# Patient Record
Sex: Female | Born: 1975
Health system: Southern US, Community
[De-identification: ages and names within clinical notes are randomized; demographics above are authoritative.]

## PROBLEM LIST (undated history)

## (undated) DIAGNOSIS — Z8619 Personal history of other infectious and parasitic diseases: Secondary | ICD-10-CM

## (undated) DIAGNOSIS — O24419 Gestational diabetes mellitus in pregnancy, unspecified control: Secondary | ICD-10-CM

## (undated) DIAGNOSIS — F419 Anxiety disorder, unspecified: Secondary | ICD-10-CM

## (undated) DIAGNOSIS — M4200 Juvenile osteochondrosis of spine, site unspecified: Secondary | ICD-10-CM

## (undated) DIAGNOSIS — M797 Fibromyalgia: Secondary | ICD-10-CM

## (undated) HISTORY — PX: MOUTH SURGERY: SHX715

---

## 1997-08-10 ENCOUNTER — Other Ambulatory Visit: Admission: RE | Admit: 1997-08-10 | Discharge: 1997-08-10 | Payer: Self-pay | Admitting: Obstetrics and Gynecology

## 1997-09-22 ENCOUNTER — Other Ambulatory Visit: Admission: RE | Admit: 1997-09-22 | Discharge: 1997-09-22 | Payer: Self-pay | Admitting: *Deleted

## 1998-03-09 ENCOUNTER — Emergency Department (HOSPITAL_COMMUNITY): Admission: EM | Admit: 1998-03-09 | Discharge: 1998-03-09 | Payer: Self-pay

## 1998-08-22 ENCOUNTER — Ambulatory Visit (HOSPITAL_COMMUNITY): Admission: RE | Admit: 1998-08-22 | Discharge: 1998-08-22 | Payer: Self-pay | Admitting: Family Medicine

## 1999-10-05 ENCOUNTER — Other Ambulatory Visit: Admission: RE | Admit: 1999-10-05 | Discharge: 1999-10-05 | Payer: Self-pay | Admitting: Family Medicine

## 2000-04-23 ENCOUNTER — Encounter: Admission: RE | Admit: 2000-04-23 | Discharge: 2000-04-23 | Payer: Self-pay | Admitting: Otolaryngology

## 2000-04-23 ENCOUNTER — Encounter: Payer: Self-pay | Admitting: Otolaryngology

## 2000-10-08 ENCOUNTER — Other Ambulatory Visit: Admission: RE | Admit: 2000-10-08 | Discharge: 2000-10-08 | Payer: Self-pay | Admitting: Family Medicine

## 2001-10-19 ENCOUNTER — Other Ambulatory Visit: Admission: RE | Admit: 2001-10-19 | Discharge: 2001-10-19 | Payer: Self-pay | Admitting: Family Medicine

## 2002-10-24 ENCOUNTER — Other Ambulatory Visit: Admission: RE | Admit: 2002-10-24 | Discharge: 2002-10-24 | Payer: Self-pay | Admitting: Family Medicine

## 2003-10-26 ENCOUNTER — Other Ambulatory Visit: Admission: RE | Admit: 2003-10-26 | Discharge: 2003-10-26 | Payer: Self-pay | Admitting: Family Medicine

## 2004-11-27 ENCOUNTER — Other Ambulatory Visit: Admission: RE | Admit: 2004-11-27 | Discharge: 2004-11-27 | Payer: Self-pay | Admitting: Family Medicine

## 2005-11-28 ENCOUNTER — Other Ambulatory Visit: Admission: RE | Admit: 2005-11-28 | Discharge: 2005-11-28 | Payer: Self-pay | Admitting: Family Medicine

## 2006-11-30 ENCOUNTER — Other Ambulatory Visit: Admission: RE | Admit: 2006-11-30 | Discharge: 2006-11-30 | Payer: Self-pay | Admitting: Family Medicine

## 2007-08-10 ENCOUNTER — Encounter: Admission: RE | Admit: 2007-08-10 | Discharge: 2007-08-26 | Payer: Self-pay | Admitting: Obstetrics and Gynecology

## 2007-08-23 ENCOUNTER — Inpatient Hospital Stay (HOSPITAL_COMMUNITY): Admission: AD | Admit: 2007-08-23 | Discharge: 2007-08-23 | Payer: Self-pay | Admitting: Obstetrics and Gynecology

## 2007-11-08 ENCOUNTER — Inpatient Hospital Stay (HOSPITAL_COMMUNITY): Admission: AD | Admit: 2007-11-08 | Discharge: 2007-11-13 | Payer: Self-pay | Admitting: Obstetrics and Gynecology

## 2008-01-31 ENCOUNTER — Other Ambulatory Visit: Admission: RE | Admit: 2008-01-31 | Discharge: 2008-01-31 | Payer: Self-pay | Admitting: Family Medicine

## 2009-02-12 ENCOUNTER — Other Ambulatory Visit: Admission: RE | Admit: 2009-02-12 | Discharge: 2009-02-12 | Payer: Self-pay | Admitting: Family Medicine

## 2010-02-19 ENCOUNTER — Other Ambulatory Visit: Admission: RE | Admit: 2010-02-19 | Discharge: 2010-02-19 | Payer: Self-pay | Admitting: Family Medicine

## 2010-08-27 NOTE — Op Note (Signed)
NAME:  INDIGO, BARBIAN              ACCOUNT NO.:  [id number]   MEDICAL RECORD NO.:  [id number]          PATIENT TYPE:  INP   LOCATION:  9148                          FACILITY:  [facility]   PHYSICIAN:  Janine Limbo, M.D.DATE OF BIRTH:  1976-01-11   DATE OF PROCEDURE:  11/10/2007  DATE OF DISCHARGE:                               OPERATIVE REPORT   PREOPERATIVE DIAGNOSES:  1. Forty-one weeks' gestation.  2. Failure to progress in labor.   POSTOPERATIVE DIAGNOSES:  1. Forty-one weeks' gestation.  2. Failure to progress in labor.  3. Persistent occiput posterior presentation.   PROCEDURE:  Primary low transverse cesarean section.   SURGEON:  Janine Limbo, MD   FIRST ASSISTANT:  Larna Daughters, certified nurse midwife.   ANESTHESIA:  Epidural.   DISPOSITION:  Ms. Maclachlan is a 35 year old female, gravida 1, para 0,  who presents at 4 weeks' gestation.  The patient has been followed at  the [facility] of [facility].  This pregnancy has been largely uncomplicated.  The patient was admitted on November 08, 2007 and was given Cytotec.  Pitocin was started on the morning of November 09, 2007.  The patient did  dilate her cervix to 8 cm but could progress no further.  The patient  understands the indications for her surgical procedure, and she accepts  the risks of, but not limited to, anesthetic complications, bleeding,  infection, and possible damage to the surrounding organs.   FINDINGS:  An 8 pound 10 ounce female infant (name currently unknown)  was delivered from a persistent occiput posterior presentation.  The  Apgars were 8 at 1 minute and 9 at 5 minutes.  The uterus, fallopian  tubes, and the ovaries appeared normal for the gravid state.   DESCRIPTION OF PROCEDURE:  The patient was taken to the operating room,  where it was determined that the epidural she had received for labor  would be adequate for  cesarean delivery.  The patient's abdomen was  prepped with multiple layers of Betadine and then sterilely draped.  A  Foley catheter had previously been placed.  She was noted to have blood-  tinged urine.  The lower abdomen was injected with 10 mL of 0.5%  Marcaine with epinephrine.  A low-transverse incision was made in the  abdomen and then carried sharply through the subcutaneous tissue,  fascia, and the anterior peritoneum.  The bladder was retracted.  An  incision was made in the lower uterine segment and extended in a low  transverse fashion.  The fetal head was delivered without difficulty.  The mouth and nose were suctioned.  The remainder of the infant was then  delivered.  The cord was clamped and cut, and the infant was handed to  the waiting pediatric team.  Routine cord blood studies were obtained.  The placenta was removed.  The uterine cavity was cleaned of amniotic  fluid, clotted blood, and membranes.  The uterine incision was then  closed using a running locking suture of 2-0 Vicryl followed by an  imbricating suture  of 2-0 Vicryl.  Hemostasis was noted to be adequate.  The pelvis was vigorously irrigated.  The anterior peritoneum and the  abdominal musculature were reapproximated in the midline using 0 Vicryl.  The fascia was closed using a running suture of 0 Vicryl from the  corners to the midline followed by 3 interrupted sutures of 0 Vicryl.  The subcutaneous layer was closed using 0 Vicryl.  The skin was  reapproximated using a subcuticular suture of 3-0 Monocryl.  Sponge,  needle, and instrument counts were correct on 2 occasions.  Estimated  blood loss for the procedure was 600 mL.  The patient tolerated her  procedure well.  She was taken to the recovery room in stable condition.  The infant was taken to the full-term nursery in stable condition.      Janine Limbo, M.D.  Electronically Signed     AVS/MEDQ  D:  11/10/2007  T:  11/10/2007  Job:   [id number]

## 2010-08-27 NOTE — H&P (Signed)
NAME:  TERRENCE, PIZANA              ACCOUNT NO.:  [id number]   MEDICAL RECORD NO.:  [id number]          PATIENT TYPE:  INP   LOCATION:  9169                          [facility]ILITY[facility]  [facility]   PHYSICIAN:  Hal Morales, M.D.DATE OF BIRTH:  Feb 14, 1976   DATE OF ADMISSION:  11/08/2007  DATE OF DISCHARGE:                              HISTORY & PHYSICAL   Ms. Kernen is a 35 year old married white female primigravida at 12-  6/7 weeks who presents for induction of labor secondary to postdate.  Her pregnancy has been followed by the [facility].  MD  service has been remarkable for,  1. Fibromyalgia.  2. Scheuermann's disease.  3. Probable anxiety, possible panic attacks.  4. Hemorrhoids and constipation.  5. Group B strep negative.   PRENATAL LABS:  Prenatal labs were collected on March 22, 2007.  Hemoglobin 12.9, hematocrit 38.0, and platelets 248,000.  Blood type A+  and antibody negative, RPR nonreactive, rubella immune, hepatitis B  surface antigen negative, HIV nonreactive, gonorrhea negative, and  chlamydia negative.  TSH within normal limits.  1-hour glucose from  July 28, 2007, was elevated.  Cultures of vaginal tract for group B  strep, gonorrhea, and chlamydia from September 30, 2007, were all negative.   HISTORY OF PRESENT PREGNANCY:  The patient transferred to [facility] on June 11, 2007, at 19-3/7 weeks.  She initially started  her prenatal care at [facility].  [facility] was determined to be November 02, 2007, by first trimester ultrasound.  Anatomy ultrasound at 19-6/[redacted]  weeks gestation shows growth consistent with previous dating.  The  patient was also treated for urinary tract infection at that appointment  with Keflex.  The patient's course of antibiotics was changed to  Macrobid.  She was given another course of Macrobid at 26 weeks'  gestation.  The patient had an elevated 1-hour Glucola.  The patient was  seen in maternity admissions at 30 weeks'  gestation for sharp pains in  her abdomen.  Her 3-hour glucose tolerance test was within normal  limits.  She was again treated for urinary tract infection at 38 weeks  with Macrobid and was also given Pyridium at 39 weeks.  Rest of her  prenatal care was unremarkable.   OB HISTORY:  She is a primigravida.   PAST MEDICAL HISTORY:  The patient has an allergy to Keflex resulting in  a rash.  She experienced menarche at the age of 1 with 28-30 days  cycles.  She has used Loestrin in the past and stopped in August 2008.  She had an abnormal Pap smear 4-5 years ago.  She reports having had the  usual childhood illnesses.  She has issues with hemorrhoids and  constipation.   SURGICAL HISTORY:  Remarkable for wisdom teeth extraction in 1994,  dental surgery in 2000.  She was also hospitalized for urinary tract  infection overnight in 1998-1999.   FAMILY MEDICAL HISTORY:  Maternal grandmother with elevated blood  pressure, mother with hypothyroidism, maternal grandmother with CVA,  maternal uncle with parathyroid cancer.  Sister is anorexic and possibly  has bipolar and ADHD.  Sister also has questionable history of physical  abuse.  Maternal uncle is an alcoholic.   GENETIC HISTORY:  Remarkable for patient with Scheuermann's disease  which is curved vertebrae and also fibromyalgia.   SOCIAL HISTORY:  The patient is married, is a father of a baby.  He is  involved and supportive.  His name is Air cabin crew.  They are of the Catholic  faith.  The patient has her bachelor's degree and is an Conservator, museum/gallery.  Certainly financial, father of the baby has some college education and  is employed full time in Retail banker.  They deny any alcohol, tobacco, or  illicit drug use since positive pregnancy test.   OBJECTIVE:  VITAL SIGNS:  Stable.  She is afebrile.  HEENT:  Grossly within normal limits.  CHEST:  Clear to auscultation.  HEART:  Regular rate and rhythm.  ABDOMEN:  Gravid in contour with fundal  height extending approximately  39 cm above pubic symphysis.  Fetal heart rate is reactive reassuring.  Contractions are irregular and mild.  CERVIX:  Closed and long.  EXTREMITIES:  Within normal limits.   ASSESSMENT:  1. Intrauterine pregnancy at term.  2. Unfavorable cervix.   PLAN:  Ripen the cervix tonight with Cytotec followed by Pitocin in the  morning.  The patient to be followed by the MD service.      Cam Hai, C.N.M.      ______________________________  Hal Morales, M.D.    KS/MEDQ  D:  11/08/2007  T:  11/09/2007  Job:  [id number]

## 2010-08-27 NOTE — Discharge Summary (Signed)
NAME:  Jenna Kirk, Jenna Kirk              ACCOUNT NO.:  [id number]   MEDICAL RECORD NO.:  [id number]          PATIENT TYPE:  INP   LOCATION:  9148                          [facility]   PHYSICIAN:  Naima A. Dillard, M.D. DATE OF BIRTH:  1975/11/23   DATE OF ADMISSION:  11/08/2007  DATE OF DISCHARGE:  11/13/2007                               DISCHARGE SUMMARY   ADMISSION DIAGNOSES:  1. Intrauterine pregnancy at 40 weeks' and 6 days.  2. Induction of labor.   DISCHARGE DIAGNOSES:  1. Intrauterine pregnancy at 41 weeks', delivered.  2. Failure to progress in labor.   PROCEDURE:  Low transverse cesarean section.   HOSPITAL COURSE:  Jenna Kirk is a 35 year old married white female  gravida 1, para 0, admitted at 40-6/7th weeks' gestation for induction  of labor due to post dates.  The patient with a nonremarkable prenatal  course.  The patient's pregnancy has been remarkable for:  1. Fibromyalgia.  2. Scheuermann disease.  3. Probable anxiety.  4. Constipation, hemorrhoids.  5. Group B strep negative.   The patient was admitted for induction of labor after risks, benefits,  and alternatives were discussed with the patient.  The patient's  pregnancy has been followed by the MD Service of [facility].  The patient underwent cervical ripening with Cytotec and then  induction of labor was begun with Pitocin.  The patient received Cytotec  on the evening of November 08, 2007, with Pitocin started on November 09, 2007.  The patient progressed to 8-cm dilation; however, the patient failed to  change her cervix over a period of several hours and decision for C-  section was made after risks, benefits, and alternative were discussed  with the patient.   The patient underwent a primary low transverse cesarean section and  delivered a viable female infant, Vicente Serene, with weight 8 pounds 10  ounces, Apgars of 8 and 9 at 1 and 5 minutes respectively.  Surgery was  uncomplicated.   Estimated blood loss 600 mL.  The placenta was sent to  Labor and Delivery.  On postoperative day #1, the patient's hemoglobin  noted to be 10.2.  The patient with some abdominal distention, however,  bowel sounds were present, and the patient tolerated food and fluids.   On postoperative day #3, the patient reports her pain is well controlled  with medications.  She is ambulating, voiding, and tolerating liquids  and solids without difficulty, and is passing flatus.  The patient  continues to work with her baby on breast feeding at the present time.  The patient remained afebrile and vital signs were stable.  The patient  was deemed to have received a full benefit of her hospital stay and was  discharged home.  The patient undecided regarding contraceptive method  at the time of discharge.  Risks, benefits, and alternatives were  discussed with the patient and the patient will discuss at her 6-week  visit.   DISCHARGE INSTRUCTIONS:  Per [facility] handout.   DISCHARGE MEDICATIONS:  1. Motrin 600 mg p.o. every 6 hours p.r.n. pain.  2.  Tylox one to two tablets p.o. q.4-6 h. p.r.n. pain.  3. Prenatal vitamins.   DISCHARGE FOLLOWUP:  Will occur in 6 weeks at [facility].      Rhona Leavens, CNM      ______________________________  Pierre Bali Normand Sloop, M.D.    NOS/MEDQ  D:  11/13/2007  T:  11/14/2007  Job:  [id number]

## 2011-01-10 LAB — CBC
HCT: 30 — ABNORMAL LOW
HCT: 37.8
Hemoglobin: 10.2 — ABNORMAL LOW
Hemoglobin: 12.5
MCHC: 33.1
MCHC: 34
MCV: 87.4
MCV: 89.4
Platelets: 146 — ABNORMAL LOW
Platelets: 174
RBC: 3.35 — ABNORMAL LOW
RBC: 4.32
RDW: 14.6
RDW: 15.3
WBC: 12.2 — ABNORMAL HIGH
WBC: 12.5 — ABNORMAL HIGH

## 2011-01-10 LAB — URINE CULTURE
Colony Count: 15000
Special Requests: NEGATIVE

## 2011-01-10 LAB — CCBB MATERNAL DONOR DRAW

## 2011-01-10 LAB — RPR: RPR Ser Ql: NONREACTIVE

## 2011-01-21 ENCOUNTER — Other Ambulatory Visit: Payer: Self-pay | Admitting: Internal Medicine

## 2011-01-21 DIAGNOSIS — E041 Nontoxic single thyroid nodule: Secondary | ICD-10-CM

## 2011-02-17 ENCOUNTER — Ambulatory Visit
Admission: RE | Admit: 2011-02-17 | Discharge: 2011-02-17 | Disposition: A | Discharge status: Home / Self Care | Payer: BC Managed Care – PPO | Source: Ambulatory Visit | Attending: Internal Medicine | Admitting: Internal Medicine

## 2011-02-17 DIAGNOSIS — E041 Nontoxic single thyroid nodule: Secondary | ICD-10-CM

## 2012-05-14 ENCOUNTER — Emergency Department: Payer: Self-pay | Admitting: Emergency Medicine

## 2012-05-14 LAB — URINALYSIS, COMPLETE
Bilirubin,UR: NEGATIVE
Glucose,UR: NEGATIVE mg/dL (ref 0–75)
Nitrite: NEGATIVE
Ph: 6 (ref 4.5–8.0)
RBC,UR: 10 /HPF (ref 0–5)
Specific Gravity: 1.03 (ref 1.003–1.030)
Squamous Epithelial: 22

## 2012-05-14 LAB — COMPREHENSIVE METABOLIC PANEL
Alkaline Phosphatase: 84 U/L (ref 50–136)
Anion Gap: 8 (ref 7–16)
BUN: 18 mg/dL (ref 7–18)
Bilirubin,Total: 0.7 mg/dL (ref 0.2–1.0)
Calcium, Total: 8.3 mg/dL — ABNORMAL LOW (ref 8.5–10.1)
Chloride: 109 mmol/L — ABNORMAL HIGH (ref 98–107)
EGFR (African American): >60
EGFR (Non-African Amer.): >60
Sodium: 140 mmol/L (ref 136–145)

## 2012-05-14 LAB — CBC
MCV: 89 fL (ref 80–100)
Platelet: 220 10*3/uL (ref 150–440)

## 2012-05-14 LAB — LIPASE, BLOOD: Lipase: 84 U/L (ref 73–393)

## 2012-08-09 ENCOUNTER — Other Ambulatory Visit: Payer: Self-pay | Admitting: Family Medicine

## 2012-08-09 DIAGNOSIS — R519 Headache, unspecified: Secondary | ICD-10-CM

## 2012-08-09 DIAGNOSIS — Z8669 Personal history of other diseases of the nervous system and sense organs: Secondary | ICD-10-CM

## 2012-08-10 ENCOUNTER — Ambulatory Visit
Admission: RE | Admit: 2012-08-10 | Discharge: 2012-08-10 | Disposition: A | Discharge status: Home / Self Care | Payer: BC Managed Care – PPO | Source: Ambulatory Visit | Attending: Family Medicine | Admitting: Family Medicine

## 2012-08-10 DIAGNOSIS — Z8669 Personal history of other diseases of the nervous system and sense organs: Secondary | ICD-10-CM

## 2012-08-10 DIAGNOSIS — R519 Headache, unspecified: Secondary | ICD-10-CM

## 2013-03-07 ENCOUNTER — Other Ambulatory Visit: Payer: Self-pay | Admitting: Internal Medicine

## 2013-03-07 DIAGNOSIS — E049 Nontoxic goiter, unspecified: Secondary | ICD-10-CM

## 2013-03-09 ENCOUNTER — Ambulatory Visit
Admission: RE | Admit: 2013-03-09 | Discharge: 2013-03-09 | Disposition: A | Discharge status: Home / Self Care | Payer: BC Managed Care – PPO | Source: Ambulatory Visit | Attending: Internal Medicine | Admitting: Internal Medicine

## 2013-03-09 DIAGNOSIS — E049 Nontoxic goiter, unspecified: Secondary | ICD-10-CM

## 2013-03-24 ENCOUNTER — Other Ambulatory Visit (HOSPITAL_COMMUNITY)
Admission: RE | Admit: 2013-03-24 | Discharge: 2013-03-24 | Disposition: A | Discharge status: Home / Self Care | Payer: BC Managed Care – PPO | Source: Ambulatory Visit | Attending: Family Medicine | Admitting: Family Medicine

## 2013-03-24 ENCOUNTER — Other Ambulatory Visit: Payer: Self-pay | Admitting: Family Medicine

## 2013-03-24 DIAGNOSIS — Z124 Encounter for screening for malignant neoplasm of cervix: Secondary | ICD-10-CM | POA: Insufficient documentation

## 2016-03-26 LAB — OB RESULTS CONSOLE ABO/RH: RH TYPE: POSITIVE

## 2016-03-26 LAB — OB RESULTS CONSOLE HEPATITIS B SURFACE ANTIGEN: Hepatitis B Surface Ag: NEGATIVE

## 2016-03-26 LAB — OB RESULTS CONSOLE RPR: RPR: NONREACTIVE

## 2016-03-26 LAB — OB RESULTS CONSOLE ANTIBODY SCREEN: Antibody Screen: NEGATIVE

## 2016-03-26 LAB — OB RESULTS CONSOLE GC/CHLAMYDIA
Chlamydia: NEGATIVE
Gonorrhea: NEGATIVE

## 2016-03-26 LAB — OB RESULTS CONSOLE RUBELLA ANTIBODY, IGM: RUBELLA: IMMUNE

## 2016-03-26 LAB — OB RESULTS CONSOLE HIV ANTIBODY (ROUTINE TESTING): HIV: NONREACTIVE

## 2016-08-13 ENCOUNTER — Encounter: Payer: BLUE CROSS/BLUE SHIELD | Attending: Obstetrics and Gynecology | Admitting: Registered"

## 2016-08-13 ENCOUNTER — Other Ambulatory Visit: Payer: Self-pay | Admitting: Obstetrics and Gynecology

## 2016-08-13 DIAGNOSIS — Z713 Dietary counseling and surveillance: Secondary | ICD-10-CM | POA: Diagnosis not present

## 2016-08-13 DIAGNOSIS — O99810 Abnormal glucose complicating pregnancy: Secondary | ICD-10-CM | POA: Diagnosis not present

## 2016-08-13 DIAGNOSIS — Z3A00 Weeks of gestation of pregnancy not specified: Secondary | ICD-10-CM | POA: Diagnosis not present

## 2016-08-13 DIAGNOSIS — R7309 Other abnormal glucose: Secondary | ICD-10-CM

## 2016-08-14 ENCOUNTER — Encounter: Payer: Self-pay | Admitting: Registered"

## 2016-08-14 NOTE — Progress Notes (Signed)
  Patient was seen on 08/13/2016 for Gestational Diabetes self-management class at the [facility]. The following learning objectives were met by the patient during this course:   States the definition of Gestational Diabetes  States why dietary management is important in controlling blood glucose  Describes the effects each nutrient has on blood glucose levels  Demonstrates ability to create a balanced meal plan  Demonstrates carbohydrate counting   States when to check blood glucose levels  Demonstrates proper blood glucose monitoring techniques  States the effect of stress and exercise on blood glucose levels  States the importance of limiting caffeine and abstaining from alcohol and smoking  Blood glucose monitor given: Contour Next Lot # [id number] Exp: 2017-01-11 Blood glucose reading: 120  Patient instructed to monitor glucose levels: FBS: 60 - <90 1 hour: <140 2 hour: <120  *Patient received handouts:  Nutrition Diabetes and Pregnancy  Carbohydrate Counting List  Patient will be seen for follow-up as needed.

## 2016-09-30 ENCOUNTER — Encounter (HOSPITAL_COMMUNITY): Payer: Self-pay | Admitting: *Deleted

## 2016-10-13 DIAGNOSIS — Z98891 History of uterine scar from previous surgery: Secondary | ICD-10-CM

## 2016-10-13 DIAGNOSIS — Z87898 Personal history of other specified conditions: Secondary | ICD-10-CM

## 2016-10-13 DIAGNOSIS — Z283 Underimmunization status: Secondary | ICD-10-CM

## 2016-10-13 DIAGNOSIS — O09899 Supervision of other high risk pregnancies, unspecified trimester: Secondary | ICD-10-CM

## 2016-10-13 DIAGNOSIS — M797 Fibromyalgia: Secondary | ICD-10-CM | POA: Diagnosis present

## 2016-10-13 DIAGNOSIS — Z2839 Other underimmunization status: Secondary | ICD-10-CM

## 2016-10-13 DIAGNOSIS — F419 Anxiety disorder, unspecified: Secondary | ICD-10-CM | POA: Diagnosis not present

## 2016-10-13 DIAGNOSIS — Z881 Allergy status to other antibiotic agents status: Secondary | ICD-10-CM

## 2016-10-13 DIAGNOSIS — O24419 Gestational diabetes mellitus in pregnancy, unspecified control: Secondary | ICD-10-CM | POA: Diagnosis present

## 2016-10-13 DIAGNOSIS — M4200 Juvenile osteochondrosis of spine, site unspecified: Secondary | ICD-10-CM | POA: Diagnosis present

## 2016-10-13 NOTE — Patient Instructions (Signed)
20 Jenna Kirk  10/13/2016   Your procedure is scheduled on:  10/16/2016  Enter through the [facility] Entr[facility] at 0730 AM.  Pick up the phone at the desk and dial [phone].   Call this number if you have problems the morning of surgery: [phone]   Remember:   Do not eat food:After Midnight.  Do not drink clear liquids: After Midnight.  Take these medicines the morning of surgery with A SIP OF WATER: NO MEDICATIONS IN THE MORNING OF SURGERY   Do not wear jewelry, make-up or nail polish.  Do not wear lotions, powders, or perfumes. Do not wear deodorant.  Do not shave 48 hours prior to surgery.  Do not bring valuables to the hospital.  [facility] is not   responsible for any belongings or valuables brought to the hospital.  Contacts, dentures or bridgework may not be worn into surgery.  Leave suitcase in the car. After surgery it may be brought to your room.  For patients admitted to the hospital, checkout time is 11:00 AM the day of              discharge.   Patients discharged the day of surgery will not be allowed to drive             home.  Name and phone number of your driver: NA  Special Instructions:   N/A   Please read over the following fact sheets that you were given:   Surgical Site Infection Prevention

## 2016-10-13 NOTE — H&P (Signed)
Jenna Kirk is a 41 y.o. female, G2P1001 at 5339 weeks, presenting on 10/16/16 for repeat cesarean birth.  Patient declines BTL.  Denies HA, visual sx, epigastric pain, bleeding, or leaking. Reports occasional UCs, notes good FM.  Patient Active Problem List   Diagnosis Date Noted  . Anxiety 10/13/2016  . Fibromyalgia 10/13/2016  . Gestational diabetes--on Glyburide 10/13/2016  . Scheuermann disease 10/13/2016  . Maternal varicella, non-immune 10/13/2016  . History of cesarean section--2012, FTP 10/13/2016  . History of poor fetal growth--lag in Jenna Kirk 10/13/2016  . Allergy to cephalosporin 10/13/2016    History of present pregnancy: Patient entered care at 9 6/7 weeks.   EDC of 10/23/16 was established by LMP and in agreement with US at 11 weeks.   Anatomy scan: 19 4/7 weeks, with limited anatomy and an anterior placenta.  EFW 15%ile, HC at 17%ile. Additional US evaluations:   23 6/7 weeks:    Completion of anatomy, EFW 18%ile, BPD and HC lagging at 5%ile.  Normal fluid, cervix closed. 27 weeks:  EFW 31%ile, BPD lag at 4% and HC at 7%ile, normal fluid and cervical length. 31 weeks:  EFW 3+12, 41%ile, HC 8%ile, BPD 14%ile, normal fluid, cervix closed. Followed with weekly BPPs from 32 weeks due to GDM, on meds--normal, and normal fluid. 34 6/7 weeks:  Vtx, EFW 5+8, 43%ile, HC 13%ile, BPD 52%ile, normal fluid. Significant prenatal events:  Normal genetic testing.  Initially interested in VBAC, then chose to plan repeat C/S.  Rash noted at 22 weeks, normal CBC/CMP.  Elevated 1 hour GTT at 200--dx with GDM, on glyburide since 31 weeks, most recent dosing at 10 mg in am and 7.5 mg in evening.  Growth followed for GDM and lag in HC.  Treated for athlete's foot at 27 weeks.  Repeat C/S scheduled at 39 weeks with Dr. Normand Sloopillard. Last evaluation:  10/09/16--BP 114/76, weight 208.  BPP 8/8, AFI 35%ile.  OB History    Gravida Para Term Preterm AB Living   2 1 1     1    SAB TAB Ectopic Multiple  Live Births           1    2012--Primary LTCS, FTP at 8 cm, female, Jenna Kirk, 8+10, Dr. Rosalyn ChartersStringer/Hilary  Past Medical History:  Diagnosis Date  . Anxiety   . Fibromyalgia   . Gestational diabetes    glyburide  . Hx of varicella   . Scheuermann disease    Past Surgical History:  Procedure Laterality Date  . CESAREAN SECTION    . MOUTH SURGERY     Family History: family history includes Aneurysm in her maternal grandfather; Stroke in her maternal grandmother; Thyroid disease in her father and mother. Parathyroid cancer MU; Alcoholism MU; Migraine father; Depression sister; ADHD sister; Anorexia sister; Bipolar sister; HTN MGM.  Social History:  reports that she has never smoked. She has never used smokeless tobacco. She reports that she does not drink alcohol or use drugs.  Patient is Caucasian, of the [place] faith, married to Jenna Kirk, who is involved and supportive.  Patient has post-grad education, employed as an Product/process development scientistauditor.   Prenatal Transfer Tool  Maternal Diabetes: Yes:  Diabetes Type:  Insulin/Medication controlled--On Glyburide Genetic Screening: Normal 1st trimester screen and AFP Maternal Ultrasounds/Referrals: Abnormal:  Findings:   Other: Lag in HC from 23 weeks, measuring at 13%ile at 34 6/7 weeks. Fetal Ultrasounds or other Referrals:  None Maternal Substance Abuse:  No Significant Maternal Medications:  Meds include: Other: Glyburide Significant Maternal  Lab Results: Lab values include: Group B Strep negative  TDAP NA Flu 01/13/16  ROS:  Fetal movement, occasional UCs.  Allergies  Allergen Reactions  . Keflex [Cephalexin] Rash  . Other Rash    Dodecyl gallate - allergic contact dermatitis       Last menstrual period 01/17/2016.  Chest clear Heart RRR without murmur Abd gravid, NT, FH 37 cm at last visit Pelvic: Deferred Ext: WNL  FHR: 140 at last visit UCs:  Occasional, mild  Prenatal labs: ABO, Rh: A/Positive/-- (12/13 0000) Antibody: Negative  (12/13 0000) Rubella:  Immune RPR: Nonreactive (12/13 0000)  HBsAg: Negative (12/13 0000)  HIV: Non-reactive (12/13 0000)  GBS:  Negative 10/02/16 Sickle cell/Hgb electrophoresis:  NA Pap:  2014 WNL GC:  Negative 03/26/2016 Chlamydia: Negative 03/26/16 Genetic screenings:  Normal 1st trimester screen and AFP Glucola:  Elevated 1 hour at 27 weeks Other:   Hgb 14.4 at NOB, 12.5 at 28 weeks CMP WNL 06/26/16 Varicella non-immune       Assessment/Plan: IUP at 39 weeks Previous C/S, desires repeat Gestational diabetes, on Glyburide Juvenile osteochondrosis of spine/Scheuermann's dx Fibromyalgia GBS negative AMA--normal genetic testing Varicella non-immune HC lag, EFW WNL Allergy to Keflex   Plan: Admit to [facility] on 10/16/16 per consult with Dr. Normand Sloop Routine [facility] pre-op orders Offer varicella vaccine pp   Jenna Kirk CNM, [place] 10/13/2016, 11:41 AM

## 2016-10-14 ENCOUNTER — Other Ambulatory Visit (HOSPITAL_COMMUNITY)
Admission: AD | Admit: 2016-10-14 | Discharge: 2016-10-14 | Disposition: A | Discharge status: Home / Self Care | Payer: BLUE CROSS/BLUE SHIELD | Source: Ambulatory Visit | Attending: Obstetrics and Gynecology | Admitting: Obstetrics and Gynecology

## 2016-10-14 ENCOUNTER — Encounter (HOSPITAL_COMMUNITY)
Admission: RE | Admit: 2016-10-14 | Discharge: 2016-10-14 | Disposition: A | Discharge status: Home / Self Care | Payer: BLUE CROSS/BLUE SHIELD | Source: Ambulatory Visit | Attending: Obstetrics and Gynecology | Admitting: Obstetrics and Gynecology

## 2016-10-14 HISTORY — DX: Juvenile osteochondrosis of spine, site unspecified: M42.00

## 2016-10-14 HISTORY — DX: Gestational diabetes mellitus in pregnancy, unspecified control: O24.419

## 2016-10-14 HISTORY — DX: Personal history of other infectious and parasitic diseases: Z86.19

## 2016-10-14 HISTORY — DX: Fibromyalgia: M79.7

## 2016-10-14 HISTORY — DX: Anxiety disorder, unspecified: F41.9

## 2016-10-14 LAB — TYPE AND SCREEN
ABO/RH(D): A POS
Antibody Screen: NEGATIVE

## 2016-10-14 LAB — CBC
HCT: 37.6 % (ref 36.0–46.0)
HEMOGLOBIN: 12.5 g/dL (ref 12.0–15.0)
MCH: 28.7 pg (ref 26.0–34.0)
MCHC: 33.2 g/dL (ref 30.0–36.0)
MCV: 86.2 fL (ref 78.0–100.0)
PLATELETS: 216 10*3/uL (ref 150–400)
RBC: 4.36 MIL/uL (ref 3.87–5.11)
RDW: 13.7 % (ref 11.5–15.5)
WBC: 11.2 10*3/uL — AB (ref 4.0–10.5)

## 2016-10-15 LAB — RPR: RPR: NONREACTIVE

## 2016-10-15 LAB — ABO/RH: ABO/RH(D): A POS

## 2016-10-16 ENCOUNTER — Inpatient Hospital Stay (HOSPITAL_COMMUNITY)
Admission: RE | Admit: 2016-10-16 | Discharge: 2016-10-19 | DRG: 766 | Disposition: A | Discharge status: Home / Self Care | Payer: BLUE CROSS/BLUE SHIELD | Source: Ambulatory Visit | Attending: Obstetrics and Gynecology | Admitting: Obstetrics and Gynecology

## 2016-10-16 ENCOUNTER — Inpatient Hospital Stay (HOSPITAL_COMMUNITY): Payer: BLUE CROSS/BLUE SHIELD | Admitting: Anesthesiology

## 2016-10-16 ENCOUNTER — Encounter (HOSPITAL_COMMUNITY): Payer: Self-pay | Admitting: *Deleted

## 2016-10-16 ENCOUNTER — Encounter (HOSPITAL_COMMUNITY)
Admission: RE | Disposition: A | Discharge status: Home / Self Care | Payer: Self-pay | Source: Ambulatory Visit | Attending: Obstetrics and Gynecology

## 2016-10-16 DIAGNOSIS — Z283 Underimmunization status: Secondary | ICD-10-CM

## 2016-10-16 DIAGNOSIS — O09899 Supervision of other high risk pregnancies, unspecified trimester: Secondary | ICD-10-CM

## 2016-10-16 DIAGNOSIS — Z3A39 39 weeks gestation of pregnancy: Secondary | ICD-10-CM | POA: Diagnosis not present

## 2016-10-16 DIAGNOSIS — Z881 Allergy status to other antibiotic agents status: Secondary | ICD-10-CM

## 2016-10-16 DIAGNOSIS — O34211 Maternal care for low transverse scar from previous cesarean delivery: Secondary | ICD-10-CM | POA: Diagnosis present

## 2016-10-16 DIAGNOSIS — Z98891 History of uterine scar from previous surgery: Secondary | ICD-10-CM

## 2016-10-16 DIAGNOSIS — O99344 Other mental disorders complicating childbirth: Secondary | ICD-10-CM | POA: Diagnosis present

## 2016-10-16 DIAGNOSIS — F419 Anxiety disorder, unspecified: Secondary | ICD-10-CM | POA: Diagnosis not present

## 2016-10-16 DIAGNOSIS — M797 Fibromyalgia: Secondary | ICD-10-CM | POA: Diagnosis present

## 2016-10-16 DIAGNOSIS — O24425 Gestational diabetes mellitus in childbirth, controlled by oral hypoglycemic drugs: Secondary | ICD-10-CM | POA: Diagnosis present

## 2016-10-16 DIAGNOSIS — O24419 Gestational diabetes mellitus in pregnancy, unspecified control: Secondary | ICD-10-CM | POA: Diagnosis present

## 2016-10-16 DIAGNOSIS — Z2839 Other underimmunization status: Secondary | ICD-10-CM

## 2016-10-16 DIAGNOSIS — M4200 Juvenile osteochondrosis of spine, site unspecified: Secondary | ICD-10-CM | POA: Diagnosis present

## 2016-10-16 DIAGNOSIS — Z87898 Personal history of other specified conditions: Secondary | ICD-10-CM

## 2016-10-16 LAB — GLUCOSE, CAPILLARY: GLUCOSE-CAPILLARY: 122 mg/dL — AB (ref 65–99)

## 2016-10-16 SURGERY — Surgical Case
Anesthesia: Spinal

## 2016-10-16 MED ORDER — SIMETHICONE 80 MG PO CHEW
80.0000 mg | CHEWABLE_TABLET | ORAL | Status: DC
  Administered 2016-10-16 – 2016-10-18 (×3): 80 mg via ORAL
  Filled 2016-10-16 (×3): qty 1

## 2016-10-16 MED ORDER — NALOXONE HCL 2 MG/2ML IJ SOSY: 1.0000 ug/kg/h | PREFILLED_SYRINGE | INTRAMUSCULAR | Status: DC | PRN

## 2016-10-16 MED ORDER — FENTANYL CITRATE (PF) 100 MCG/2ML IJ SOLN
INTRAMUSCULAR | Status: DC | PRN
  Administered 2016-10-16: 10 ug via INTRATHECAL

## 2016-10-16 MED ORDER — ONDANSETRON HCL 4 MG/2ML IJ SOLN
INTRAMUSCULAR | Status: AC
Start: 2016-10-16 — End: 2016-10-16
  Filled 2016-10-16: qty 2

## 2016-10-16 MED ORDER — OXYTOCIN 40 UNITS IN LACTATED RINGERS INFUSION - SIMPLE MED: 2.5000 [IU]/h | INTRAVENOUS | Status: AC

## 2016-10-16 MED ORDER — LACTATED RINGERS IV SOLN: INTRAVENOUS | Status: DC

## 2016-10-16 MED ORDER — FENTANYL CITRATE (PF) 100 MCG/2ML IJ SOLN
INTRAMUSCULAR | Status: AC
  Filled 2016-10-16: qty 2

## 2016-10-16 MED ORDER — MEPERIDINE HCL 25 MG/ML IJ SOLN: 6.2500 mg | INTRAMUSCULAR | Status: DC | PRN

## 2016-10-16 MED ORDER — ONDANSETRON HCL 4 MG/2ML IJ SOLN
INTRAMUSCULAR | Status: DC | PRN
  Administered 2016-10-16: 4 mg via INTRAVENOUS

## 2016-10-16 MED ORDER — FENTANYL CITRATE (PF) 100 MCG/2ML IJ SOLN
25.0000 ug | INTRAMUSCULAR | Status: DC | PRN
  Administered 2016-10-16: 50 ug via INTRAVENOUS

## 2016-10-16 MED ORDER — METOCLOPRAMIDE HCL 5 MG/ML IJ SOLN: 10.0000 mg | Freq: Once | INTRAMUSCULAR | Status: DC | PRN

## 2016-10-16 MED ORDER — NALBUPHINE SYRINGE 5 MG/0.5 ML: 5.0000 mg | INJECTION | Freq: Once | INTRAMUSCULAR | Status: DC | PRN

## 2016-10-16 MED ORDER — SODIUM CHLORIDE 0.9% FLUSH: 3.0000 mL | INTRAVENOUS | Status: DC | PRN

## 2016-10-16 MED ORDER — DIPHENHYDRAMINE HCL 25 MG PO CAPS: 25.0000 mg | ORAL_CAPSULE | Freq: Four times a day (QID) | ORAL | Status: DC | PRN

## 2016-10-16 MED ORDER — SCOPOLAMINE 1 MG/3DAYS TD PT72: 1.0000 | MEDICATED_PATCH | Freq: Once | TRANSDERMAL | Status: DC

## 2016-10-16 MED ORDER — MORPHINE SULFATE (PF) 0.5 MG/ML IJ SOLN
INTRAMUSCULAR | Status: DC | PRN
  Administered 2016-10-16: .2 mg via INTRATHECAL

## 2016-10-16 MED ORDER — KETOROLAC TROMETHAMINE 30 MG/ML IJ SOLN: 30.0000 mg | Freq: Four times a day (QID) | INTRAMUSCULAR | Status: DC | PRN

## 2016-10-16 MED ORDER — MENTHOL 3 MG MT LOZG
1.0000 | LOZENGE | OROMUCOSAL | Status: DC | PRN
  Filled 2016-10-16: qty 9

## 2016-10-16 MED ORDER — ONDANSETRON HCL 4 MG/2ML IJ SOLN: 4.0000 mg | Freq: Three times a day (TID) | INTRAMUSCULAR | Status: DC | PRN

## 2016-10-16 MED ORDER — OXYTOCIN 10 UNIT/ML IJ SOLN
INTRAVENOUS | Status: DC | PRN
  Administered 2016-10-16: 40 [IU] via INTRAVENOUS

## 2016-10-16 MED ORDER — SOD CITRATE-CITRIC ACID 500-334 MG/5ML PO SOLN
30.0000 mL | Freq: Once | ORAL | Status: AC
  Administered 2016-10-16: 30 mL via ORAL
  Filled 2016-10-16: qty 15

## 2016-10-16 MED ORDER — KETOROLAC TROMETHAMINE 30 MG/ML IJ SOLN
INTRAMUSCULAR | Status: AC
  Administered 2016-10-16: 30 mg
  Filled 2016-10-16: qty 1

## 2016-10-16 MED ORDER — PHENYLEPHRINE 8 MG IN D5W 100 ML (0.08MG/ML) PREMIX OPTIME
INJECTION | INTRAVENOUS | Status: DC | PRN
  Administered 2016-10-16: 60 ug/min via INTRAVENOUS

## 2016-10-16 MED ORDER — DEXTROSE 5 % IV SOLN
Freq: Once | INTRAVENOUS | Status: AC
  Administered 2016-10-16: 100 mL via INTRAVENOUS
  Filled 2016-10-16: qty 9.5

## 2016-10-16 MED ORDER — DEXAMETHASONE SODIUM PHOSPHATE 10 MG/ML IJ SOLN
INTRAMUSCULAR | Status: AC
  Filled 2016-10-16: qty 1

## 2016-10-16 MED ORDER — IBUPROFEN 600 MG PO TABS
600.0000 mg | ORAL_TABLET | Freq: Four times a day (QID) | ORAL | Status: DC
  Administered 2016-10-16 – 2016-10-19 (×12): 600 mg via ORAL
  Filled 2016-10-16 (×12): qty 1

## 2016-10-16 MED ORDER — SCOPOLAMINE 1 MG/3DAYS TD PT72
1.0000 | MEDICATED_PATCH | Freq: Once | TRANSDERMAL | Status: DC
  Administered 2016-10-16: 1.5 mg via TRANSDERMAL
  Filled 2016-10-16: qty 1

## 2016-10-16 MED ORDER — NALOXONE HCL 0.4 MG/ML IJ SOLN: 0.4000 mg | INTRAMUSCULAR | Status: DC | PRN

## 2016-10-16 MED ORDER — COCONUT OIL OIL: 1.0000 "application " | TOPICAL_OIL | Status: DC | PRN

## 2016-10-16 MED ORDER — NALBUPHINE SYRINGE 5 MG/0.5 ML: 5.0000 mg | INJECTION | INTRAMUSCULAR | Status: DC | PRN

## 2016-10-16 MED ORDER — METOCLOPRAMIDE HCL 5 MG/ML IJ SOLN
INTRAMUSCULAR | Status: AC
  Filled 2016-10-16: qty 2

## 2016-10-16 MED ORDER — DIPHENHYDRAMINE HCL 25 MG PO CAPS: 25.0000 mg | ORAL_CAPSULE | ORAL | Status: DC | PRN

## 2016-10-16 MED ORDER — DIBUCAINE 1 % RE OINT: 1.0000 "application " | TOPICAL_OINTMENT | RECTAL | Status: DC | PRN

## 2016-10-16 MED ORDER — DIPHENHYDRAMINE HCL 50 MG/ML IJ SOLN: 12.5000 mg | INTRAMUSCULAR | Status: DC | PRN

## 2016-10-16 MED ORDER — MORPHINE SULFATE (PF) 0.5 MG/ML IJ SOLN
INTRAMUSCULAR | Status: AC
  Filled 2016-10-16: qty 10

## 2016-10-16 MED ORDER — TETANUS-DIPHTH-ACELL PERTUSSIS 5-2.5-18.5 LF-MCG/0.5 IM SUSP: 0.5000 mL | Freq: Once | INTRAMUSCULAR | Status: DC

## 2016-10-16 MED ORDER — SIMETHICONE 80 MG PO CHEW: 80.0000 mg | CHEWABLE_TABLET | ORAL | Status: DC | PRN

## 2016-10-16 MED ORDER — BUPIVACAINE IN DEXTROSE 0.75-8.25 % IT SOLN
INTRATHECAL | Status: DC | PRN
  Administered 2016-10-16: 1.4 mL via INTRATHECAL

## 2016-10-16 MED ORDER — WITCH HAZEL-GLYCERIN EX PADS: 1.0000 "application " | MEDICATED_PAD | CUTANEOUS | Status: DC | PRN

## 2016-10-16 MED ORDER — DEXAMETHASONE SODIUM PHOSPHATE 10 MG/ML IJ SOLN
INTRAMUSCULAR | Status: DC | PRN
  Administered 2016-10-16: 10 mg via INTRAVENOUS

## 2016-10-16 MED ORDER — BUPIVACAINE IN DEXTROSE 0.75-8.25 % IT SOLN
INTRATHECAL | Status: AC
  Filled 2016-10-16: qty 2

## 2016-10-16 MED ORDER — FENTANYL CITRATE (PF) 100 MCG/2ML IJ SOLN
INTRAMUSCULAR | Status: AC
  Administered 2016-10-16: 25 ug
  Filled 2016-10-16: qty 2

## 2016-10-16 MED ORDER — PHENYLEPHRINE 8 MG IN D5W 100 ML (0.08MG/ML) PREMIX OPTIME
INJECTION | INTRAVENOUS | Status: AC
  Filled 2016-10-16: qty 100

## 2016-10-16 MED ORDER — LACTATED RINGERS IV SOLN
INTRAVENOUS | Status: DC
  Administered 2016-10-16 (×2): via INTRAVENOUS

## 2016-10-16 MED ORDER — ZOLPIDEM TARTRATE 5 MG PO TABS: 5.0000 mg | ORAL_TABLET | Freq: Every evening | ORAL | Status: DC | PRN

## 2016-10-16 MED ORDER — SIMETHICONE 80 MG PO CHEW
80.0000 mg | CHEWABLE_TABLET | Freq: Three times a day (TID) | ORAL | Status: DC
  Administered 2016-10-16 – 2016-10-19 (×8): 80 mg via ORAL
  Filled 2016-10-16 (×7): qty 1

## 2016-10-16 MED ORDER — METOCLOPRAMIDE HCL 5 MG/ML IJ SOLN
INTRAMUSCULAR | Status: DC | PRN
  Administered 2016-10-16 (×2): 5 mg via INTRAVENOUS

## 2016-10-16 MED ORDER — SENNOSIDES-DOCUSATE SODIUM 8.6-50 MG PO TABS
2.0000 | ORAL_TABLET | ORAL | Status: DC
  Administered 2016-10-16 – 2016-10-18 (×3): 2 via ORAL
  Filled 2016-10-16 (×3): qty 2

## 2016-10-16 MED ORDER — PRENATAL MULTIVITAMIN CH
1.0000 | ORAL_TABLET | Freq: Every day | ORAL | Status: DC
  Administered 2016-10-17 – 2016-10-19 (×3): 1 via ORAL
  Filled 2016-10-16 (×3): qty 1

## 2016-10-16 MED ORDER — ACETAMINOPHEN 325 MG PO TABS: 650.0000 mg | ORAL_TABLET | ORAL | Status: DC | PRN

## 2016-10-16 MED ORDER — SODIUM CHLORIDE 0.9 % IR SOLN
Status: DC | PRN
  Administered 2016-10-16: 1000 mL

## 2016-10-16 MED ORDER — LACTATED RINGERS IV SOLN
INTRAVENOUS | Status: DC
  Administered 2016-10-16: 10:00:00 via INTRAVENOUS

## 2016-10-16 MED ORDER — OXYTOCIN 10 UNIT/ML IJ SOLN
INTRAMUSCULAR | Status: AC
  Filled 2016-10-16: qty 4

## 2016-10-16 SURGICAL SUPPLY — 36 items
APL SKNCLS STERI-STRIP NONHPOA (GAUZE/BANDAGES/DRESSINGS) ×1
BENZOIN TINCTURE PRP APPL 2/3 (GAUZE/BANDAGES/DRESSINGS) ×2 IMPLANT
CHLORAPREP W/TINT 26ML (MISCELLANEOUS) ×2 IMPLANT
CLAMP CORD UMBIL (MISCELLANEOUS) IMPLANT
CLOTH BEACON ORANGE TIMEOUT ST (SAFETY) ×2 IMPLANT
CONTAINER PREFILL 10% NBF 15ML (MISCELLANEOUS) IMPLANT
DRAIN JACKSON PRT FLT 10 (DRAIN) IMPLANT
DRSG OPSITE POSTOP 4X10 (GAUZE/BANDAGES/DRESSINGS) ×2 IMPLANT
ELECT REM PT RETURN 9FT ADLT (ELECTROSURGICAL) ×2
ELECTRODE REM PT RTRN 9FT ADLT (ELECTROSURGICAL) ×1 IMPLANT
EVACUATOR SILICONE 100CC (DRAIN) IMPLANT
EXTRACTOR VACUUM M CUP 4 TUBE (SUCTIONS) IMPLANT
GLOVE BIO SURGEON STRL SZ 6.5 (GLOVE) ×2 IMPLANT
GLOVE BIOGEL PI IND STRL 7.0 (GLOVE) ×2 IMPLANT
GLOVE BIOGEL PI INDICATOR 7.0 (GLOVE) ×2
GOWN STRL REUS W/TWL LRG LVL3 (GOWN DISPOSABLE) ×4 IMPLANT
HEMOSTAT SURGICEL 2X14 (HEMOSTASIS) ×1 IMPLANT
KIT ABG SYR 3ML LUER SLIP (SYRINGE) IMPLANT
NDL HYPO 25X5/8 SAFETYGLIDE (NEEDLE) IMPLANT
NEEDLE HYPO 25X5/8 SAFETYGLIDE (NEEDLE) IMPLANT
NS IRRIG 1000ML POUR BTL (IV SOLUTION) ×2 IMPLANT
PACK C SECTION WH (CUSTOM PROCEDURE TRAY) ×2 IMPLANT
PAD OB MATERNITY 4.3X12.25 (PERSONAL CARE ITEMS) ×2 IMPLANT
PENCIL SMOKE EVAC W/HOLSTER (ELECTROSURGICAL) ×2 IMPLANT
RTRCTR C-SECT PINK 25CM LRG (MISCELLANEOUS) ×1 IMPLANT
STRIP CLOSURE SKIN 1/2X4 (GAUZE/BANDAGES/DRESSINGS) ×2 IMPLANT
SUT CHROMIC 0 CT 1 (SUTURE) ×2 IMPLANT
SUT MNCRL AB 3-0 PS2 27 (SUTURE) ×2 IMPLANT
SUT PLAIN 2 0 (SUTURE) ×4
SUT PLAIN 2 0 XLH (SUTURE) ×2 IMPLANT
SUT PLAIN ABS 2-0 CT1 27XMFL (SUTURE) ×2 IMPLANT
SUT SILK 2 0 SH (SUTURE) IMPLANT
SUT VIC AB 0 CTX 36 (SUTURE) ×8
SUT VIC AB 0 CTX36XBRD ANBCTRL (SUTURE) ×4 IMPLANT
TOWEL OR 17X24 6PK STRL BLUE (TOWEL DISPOSABLE) ×2 IMPLANT
TRAY FOLEY BAG SILVER LF 14FR (SET/KITS/TRAYS/PACK) ×2 IMPLANT

## 2016-10-16 NOTE — Op Note (Signed)
Cesarean Section Procedure Note   [name]  10/16/2016  Indications: Scheduled Proceedure/Maternal Request   Pre-operative Diagnosis: Term 39 weeks; desires repeat cesarean section.   Post-operative Diagnosis: Same   Surgeon: Surgeon(s) and Role:    * Jaymes Graffillard, Fallynn Gravett, MD - Primary   Assistants: FNA   Anesthesia: spinal   Procedure Details:  The patient was seen in the Holding Room. The risks, benefits, complications, treatment options, and expected outcomes were discussed with the patient. The patient concurred with the proposed plan, giving informed consent. identified as Jenna Kirk and the procedure verified as C-Section Delivery. A Time Out was held and the above information confirmed.  After induction of anesthesia, the patient was draped and prepped in the usual sterile manner. A transverse incision was made and carried down through the subcutaneous tissue to the fascia. Fascial incision was made in the midline and extended transversely. The fascia was separated from the underlying rectus muscle superiorly and inferiorly. The peritoneum was identified and entered. Peritoneal incision was extended longitudinally with good visualization of bowel and bladder. The utero-vesical peritoneal reflection was incised transversely and the bladder flap was bluntly freed from the lower uterine segment.  An alexsis retractor was placed in the abdomen.   A low transverse uterine incision was made. Delivered from cephalic presentation was a  infant, with Apgar scores of 8 at one minute and 9 at five minutes. Cord ph was not sent the umbilical cord was clamped and cut cord blood was obtained for evaluation. The placenta was removed Intact and appeared normal. The uterine outline, tubes and ovaries appeared normal}. The uterine incision was closed with running locked sutures of 0Vicryl. A second layer 0 vicrlyl was used to imbricate the uterine incision    Hemostasis was observed.  Lavage was carried out until clear. The alexsis was removed.  The peritoneum was closed with 0 chromic.  The muscles were examined and any bleeders were made hemostatic using bovie cautery device.   The fascia was then reapproximated with running sutures of 0 vicryl.  The subcutaneous tissue was reapproximated  With interrupted stitches using 2-0 plain gut. The subcuticular closure was performed using 3-600monocryl     Instrument, sponge, and needle counts were correct prior the abdominal closure and were correct at the conclusion of the case.    Findings: infant was delivered from vtx  presentation. The fluid was clear.  The uterus tubes and ovaries appeared normal.     Estimated Blood Loss: 700cc   Total IV Fluids: 2700ml   Urine Output: 200CC OF clear urine  Specimens: placenta to pathology  Complications: no complications  Disposition: PACU - hemodynamically stable.   Maternal Condition: stable   Baby condition / location:  Couplet care / Skin to Skin  Attending Attestation: I performed the procedure.   Signed: Surgeon(s): Jaymes Graffillard, [name], MD

## 2016-10-16 NOTE — Anesthesia Preprocedure Evaluation (Signed)
Anesthesia Evaluation  Patient identified by MRN, date of birth, ID band Patient awake    Reviewed: Allergy & Precautions, NPO status , Patient's Chart, lab work & pertinent test results  Airway Mallampati: II  TM Distance: >3 FB Neck ROM: Full    Dental no notable dental hx.    Pulmonary neg pulmonary ROS,    Pulmonary exam normal breath sounds clear to auscultation       Cardiovascular negative cardio ROS Normal cardiovascular exam Rhythm:Regular Rate:Normal     Neuro/Psych negative neurological ROS  negative psych ROS   GI/Hepatic negative GI ROS, Neg liver ROS,   Endo/Other  diabetes, Gestational  Renal/GU negative Renal ROS  negative genitourinary   Musculoskeletal  (+) Fibromyalgia -Scheuermann disease   Abdominal   Peds negative pediatric ROS (+)  Hematology negative hematology ROS (+)   Anesthesia Other Findings   Reproductive/Obstetrics negative OB ROS                             Anesthesia Physical Anesthesia Plan  ASA: II  Anesthesia Plan: Spinal   Post-op Pain Management:    Induction:   PONV Risk Score and Plan: 2 and Ondansetron and Dexamethasone  Airway Management Planned: Natural Airway  Additional Equipment:   Intra-op Plan:   Post-operative Plan:   Informed Consent: I have reviewed the patients History and Physical, chart, labs and discussed the procedure including the risks, benefits and alternatives for the proposed anesthesia with the patient or authorized representative who has indicated his/her understanding and acceptance.   Dental advisory given  Plan Discussed with:   Anesthesia Plan Comments:         Anesthesia Quick Evaluation

## 2016-10-16 NOTE — Anesthesia Procedure Notes (Signed)
Spinal  Patient location during procedure: OR Staffing Anesthesiologist: Lavontay Kirk Performed: anesthesiologist  Preanesthetic Checklist Completed: patient identified, site marked, surgical consent, pre-op evaluation, timeout performed, IV checked, risks and benefits discussed and monitors and equipment checked Spinal Block Patient position: sitting Prep: DuraPrep Patient monitoring: heart rate, continuous pulse ox and blood pressure Approach: midline Location: L3-4 Injection technique: single-shot Needle Needle type: Sprotte  Needle gauge: 24 G Needle length: 9 cm Additional Notes Expiration date of kit checked and confirmed. Patient tolerated procedure well, without complications.       

## 2016-10-16 NOTE — Progress Notes (Signed)
MOB was referred for history of depression/anxiety. * Referral screened out by Clinical Social Worker because none of the following criteria appear to apply: ~ History of anxiety/depression during this pregnancy, or of post-partum depression. ~ Diagnosis of anxiety and/or depression within last 3 years OR * MOB's symptoms currently being treated with medication and/or therapy. Please contact the Clinical Social Worker if needs arise, or if MOB requests.  MOB's medical record notes "situational anxiety."  Anxiety was added to her problem list on 04/03/16, but CSW does not see any documentation related to concerns with anxiety noted on that visit.  There are no notes in PNR that document symptoms of anxiety.  CSW asks to be consulted if MOB's score on Edinburgh PPD screen is 9 or greater or if she answers yes to question 10.    

## 2016-10-16 NOTE — Lactation Note (Signed)
This note was copied from a baby's chart. Lactation Consultation Note  Patient Name: Jenna Kirk ZOXWR'UToday's Date: 10/16/2016 Reason for consult: Initial assessment   Initial assessment with mom of 1 hour old infant in PACU. Maternal history of GDM on Glyburide. Mom reports her 818 yo had difficulty with BF for the first 2 weeks and was admitted to the hospital and was supplemented briefly. Due to "not getting enough" per mom.    Mom reports [facility], infant being held by FOB and cueing to feed. Assisted mom in latching infant to left breast in the laid back cross cradle hold, infant latched easily with active feeding. Mom with large compressible breasts and areola. Able to express small gtts colostrum that was given to infant on gloved finger, infant then relatched to breast and was actively feeding when LC left room. Mom dinied  Enc mom to feed infant STS 8-12 x in 24 hours at first feeding cues. Enc mom to use pillow and head support with feeding. Enc mom to hand express before and after feeding to stimulate milk supply. Enc mom to massage/compress breast with feedings. Enc mom to call out for assistance as needed. Feeding log given with instructions for use.   BF Resources Handout and LC Brochure given, mom informed of IP/OP Services, BF Support Groups and LC phone #. Mom has ordered a Spectra 2 pump and is awaiting its arrival. Mom without further questions/concerns at this time.   BF Resources Handout and [facility] broch[facility] given, mom informed of IP/OP Services, BF Support Groups and LC phone #. Mom has ordered a Spectra 2 pump and is awaiting its arrival. Mom is without questions/concerns at this time.      Maternal Data Formula Feeding for Exclusion: No Has patient been taught Hand Expression?: Yes Does the patient have breastfeeding experience prior to this delivery?: Yes  Feeding Feeding Type: Breast Fed Length of feed: 15 min  LATCH  Score/Interventions Latch: Grasps breast easily, tongue down, lips flanged, rhythmical sucking.  Audible Swallowing: A few with stimulation Intervention(s): Skin to skin;Hand expression;Alternate breast massage  Type of Nipple: Everted at rest and after stimulation  Comfort (Breast/Nipple): Soft / non-tender     Hold (Positioning): Assistance needed to correctly position infant at breast and maintain latch. Intervention(s): Breastfeeding basics reviewed;Support Pillows;Skin to skin;Position options  LATCH Score: 8  Lactation Tools Discussed/Used WIC Program: No   Consult Status Consult Status: Follow-up Date: 10/17/16 Follow-up type: In-patient    Jenna Kirk 10/16/2016, 11:46 AM

## 2016-10-16 NOTE — Transfer of Care (Signed)
Immediate Anesthesia Transfer of Care Note  Patient: Jenna Kirk  Procedure(s) Performed: Procedure(s) with comments: CESAREAN SECTION (N/A) - Odelia GageHeather K, RNFA  Patient Location: PACU  Anesthesia Type:Spinal  Level of Consciousness: awake, alert  and oriented  Airway & Oxygen Therapy: Patient Spontanous Breathing  Post-op Assessment: Report given to RN and Post -op Vital signs reviewed and stable  Post vital signs: Reviewed and stable  Last Vitals:  Vitals:   10/16/16 0813  BP: 122/79  Pulse: 96  Resp: 18  Temp: 37.1 C    Last Pain:  Vitals:   10/16/16 0813  TempSrc: Oral      Patients Stated Pain Goal: 4 (10/16/16 0813)  Complications: No apparent anesthesia complications

## 2016-10-17 LAB — CBC
HEMATOCRIT: 27.7 % — AB (ref 36.0–46.0)
Hemoglobin: 9.5 g/dL — ABNORMAL LOW (ref 12.0–15.0)
MCH: 29.8 pg (ref 26.0–34.0)
MCHC: 34.3 g/dL (ref 30.0–36.0)
MCV: 86.8 fL (ref 78.0–100.0)
PLATELETS: 186 10*3/uL (ref 150–400)
RBC: 3.19 MIL/uL — AB (ref 3.87–5.11)
RDW: 14 % (ref 11.5–15.5)
WBC: 12.5 10*3/uL — ABNORMAL HIGH (ref 4.0–10.5)

## 2016-10-17 MED ORDER — OXYCODONE-ACETAMINOPHEN 5-325 MG PO TABS
1.0000 | ORAL_TABLET | ORAL | Status: DC | PRN
  Administered 2016-10-17 – 2016-10-19 (×10): 1 via ORAL
  Filled 2016-10-17 (×10): qty 1

## 2016-10-17 NOTE — Anesthesia Postprocedure Evaluation (Signed)
Anesthesia Post Note  Patient: Arvid Rightrin Michelle Nauta  Procedure(s) Performed: Procedure(s) (LRB): CESAREAN SECTION (N/A)     Patient location during evaluation: PACU Anesthesia Type: Spinal Level of consciousness: awake and alert Pain management: pain level controlled Vital Signs Assessment: post-procedure vital signs reviewed and stable Respiratory status: spontaneous breathing, nonlabored ventilation, respiratory function stable and patient connected to nasal cannula oxygen Cardiovascular status: blood pressure returned to baseline and stable Postop Assessment: no signs of nausea or vomiting Anesthetic complications: no    Last Vitals:  Vitals:   10/16/16 2345 10/17/16 0450  BP: 102/63 (!) 102/58  Pulse: 70 72  Resp: 18 18  Temp: 36.8 C 36.7 C    Last Pain:  Vitals:   10/17/16 1000  TempSrc:   PainSc: 4                  Phillips Groutarignan, Reginae Wolfrey

## 2016-10-17 NOTE — Lactation Note (Signed)
This note was copied from a baby's chart. Lactation Consultation Note  Patient Name: Jenna Kirk ZOXWR'UToday's Date: 10/17/2016  Mom states that feedings are going well although some feeds are short and baby gets sleepy early into feeding.  Reviewed nursing skin to skin, waking techniques and breast massage during feeding.  Offered to set up DEBP but mom feels reassured because output is WNL.  Instructed to call for assist or to set up pump if baby remains sleepy.   Maternal Data    Feeding Feeding Type: Breast Fed Length of feed: 3 min  LATCH Score/Interventions                      Lactation Tools Discussed/Used     Consult Status      Huston FoleyMOULDEN, Daden Mahany S 10/17/2016, 3:46 PM

## 2016-10-17 NOTE — Progress Notes (Signed)
Subjective: Postpartum Day 1: Cesarean Delivery elective repeat Patient reports incisional pain, tolerating PO and + flatus.    Objective: Vital signs in last 24 hours: Temp:  [97.6 F (36.4 C)-98.2 F (36.8 C)] 98.1 F (36.7 C) (07/06 0450) Pulse Rate:  [66-85] 72 (07/06 0450) Resp:  [12-20] 18 (07/06 0450) BP: (90-105)/(51-90) 102/58 (07/06 0450) SpO2:  [96 %-100 %] 100 % (07/06 0450)  Physical Exam:  General: alert, cooperative and mild distress Lochia: appropriate Uterine Fundus: firm Incision: covered, small amount of old blood on dressing DVT Evaluation: No evidence of DVT seen on physical exam. Negative Homan's sign.   Recent Labs  10/14/16 2007 10/17/16 0501  HGB 12.5 9.5*  HCT 37.6 27.7*    Assessment/Plan: Status post Cesarean section. Doing well postoperatively.  Continue current care.  Henderson Newcomerancy Jean Prothero 10/17/2016, 9:50 AM

## 2016-10-18 NOTE — Progress Notes (Signed)
Subjective: Postpartum Day 2 : Cesarean Delivery elective repeat Patient reports incisional pain, tolerating PO, + flatus and no problems voiding.    Objective: Vital signs in last 24 hours: Temp:  [98 F (36.7 C)-98.1 F (36.7 C)] 98 F (36.7 C) (07/07 0610) Pulse Rate:  [68-75] 68 (07/07 0610) Resp:  [18] 18 (07/07 0610) BP: (90-119)/(51-60) 119/60 (07/07 0610) SpO2:  [98 %-99 %] 99 % (07/07 0610)  Physical Exam:  General: alert, cooperative and no distress Lochia: appropriate Uterine Fundus: firm Incision: healing, honey comb dressing replaced due to coming off. DVT Evaluation: No evidence of DVT seen on physical exam. Negative Homan's sign.   Recent Labs  10/17/16 0501  HGB 9.5*  HCT 27.7*    Assessment/Plan: Status post Cesarean section. Doing well postoperatively.  Continue current care.  Infant losing weight so will supplement with formula.  Plans on going home tomorrow.  Will use Mirena IUD for birthcontrol.  Henderson Newcomerancy Jean Sajan Cheatwood 10/18/2016, 9:52 AM

## 2016-10-19 MED ORDER — OXYCODONE-ACETAMINOPHEN 5-325 MG PO TABS: 1.0000 | ORAL_TABLET | ORAL | 0 refills | Status: DC | PRN

## 2016-10-19 MED ORDER — IBUPROFEN 600 MG PO TABS: 600.0000 mg | ORAL_TABLET | Freq: Four times a day (QID) | ORAL | 0 refills | Status: DC

## 2016-10-19 NOTE — Discharge Summary (Signed)
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 yo G2P2001 at [redacted]w[redacted]d was admitted to the hospital 10/16/2016 for scheduled cesarean section with the following indication:Elective Repeat.  Membrane Rupture Time/Date: 9:59 AM ,10/16/2016   Patient delivered a Viable infant.10/16/2016  Details of operation can be found in separate operative note.  Pateint had an uncomplicated postpartum course.  She is ambulating, tolerating a regular diet, passing flatus, and urinating well. Patient is discharged home in stable condition on  10/19/16         Physical exam  Vitals:   10/17/16 1800 10/18/16 0610 10/18/16 1804 10/19/16 0523  BP: (!) 90/51 119/60 (!) 107/53 (!) 113/59  Pulse: 75 68 76 86  Resp: 18 18 12 18   Temp: 98.1 F (36.7 C) 98 F (36.7 C) 98.6 F (37 C) 97.9 F (36.6 C)  TempSrc: Oral Oral Oral Oral  SpO2: 98% 99%  100%  Weight:      Height:       General: alert, cooperative and no distress Lochia: appropriate Uterine Fundus: firm Incision: Dressing is clean, dry, and intact DVT Evaluation: No evidence of DVT seen on physical exam. Negative Homan's  sign. Labs: Lab Results  Component Value Date   WBC 12.5 (H) 10/17/2016   HGB 9.5 (L) 10/17/2016   HCT 27.7 (L) 10/17/2016   MCV 86.8 10/17/2016   PLT 186 10/17/2016   CMP Latest Ref Rng & Units 05/14/2012  Glucose 65 - 99 mg/dL 409(W)  BUN 7 - 18 mg/dL 18  Creatinine 1.19 - 1.47 mg/dL 8.29  Sodium 562 - 130 mmol/L 140  Potassium 3.5 - 5.1 mmol/L 3.8  Chloride 98 - 107 mmol/L 109(H)  CO2 21 - 32 mmol/L 23  Calcium 8.5 - 10.1 mg/dL 8.3(L)  Total Protein 6.4 - 8.2 g/dL 7.8  Total Bilirubin 0.2 - 1.0 mg/dL 0.7  Alkaline Phos 50 - 136 Unit/L 84  AST 15 - 37 Unit/L 21  ALT 12 - 78 U/L 28    Discharge instruction: per After Visit Summary and "Baby and Me Booklet".  After visit meds:  Allergies as of 10/19/2016      Reactions   Keflex [cephalexin] Rash   Other Rash  Dodecyl gallate - allergic contact dermatitis      Medication List    STOP taking these medications   glyBURIDE 2.5 MG tablet Commonly known as:  DIABETA     TAKE these medications   ibuprofen 600 MG tablet Commonly known as:  ADVIL,MOTRIN Take 1 tablet (600 mg total) by mouth every 6 (six) hours.   ONE-A-DAY WOMENS PRENATAL 1 PO Take 1 tablet by mouth daily.   oxyCODONE-acetaminophen 5-325 MG tablet Commonly known as:  PERCOCET/ROXICET Take 1-2 tablets by mouth every 4 (four) hours as needed for moderate pain.       Diet: carb modified diet  Activity: Advance as tolerated. Pelvic rest for 6 weeks.   Outpatient follow up:6 weeks Follow up Appt:No future appointments. Follow up Visit:No Follow-up on file.  Postpartum contraception: IUD Mirena  Newborn Data: Live born female  Birth Weight: 6 lb 7.7 oz (2940 g) APGAR: 8, 9  Baby Feeding: Breast Disposition:home with mother   10/19/2016 Kenney HousemanNancy Jean Sachi Boulay, CNM

## 2016-10-19 NOTE — Progress Notes (Signed)
Out to car via w/c   Secured in carseat

## 2016-10-19 NOTE — Lactation Note (Signed)
This note was copied from a baby's chart. Lactation Consultation Note  Patient Name: Girl Ward Chattersrin Lindquist AVWUJ'WToday's Date: 10/19/2016 Reason for consult: Follow-up assessment   With this mom of a term baby, now 4974 hours old. The baby has an anterior short, thin frenulum, making it difficult for her to transfer at the breast. Mom agreed to trying a 20 nipple shield, which worked well for about 4 minutes, and then Sheran LawlessMadeline was fussy.  Mom had about 27 ml's of EBm , which Madeline fed by bottle, and tolerated well.  Mom had her spectra DEP with her. I washed her pump parts for her, and showed mom how to use the pump. Mom was able to express 45 ml of trsnsitional milk. Breast milk storage, heating reviewed with parents. Mom crying at beginning of consult, much calmer at the end.  I told mom she did not have to try and breast feed if too stressful, but to pump at least 8 times a day, and bottle feed the baby. I gave mom oral restriction resources, and advised the parents to gather information on tongue ties. I also made an o/p appointment with lactation for mom and baby for Thursday at 10 am.  Mom knows to call for questions/conerns. Dad present and very supportive.    Maternal Data    Feeding Feeding Type: Bottle Fed - Breast Milk Nipple Type: Slow - flow Length of feed: 15 min (5 at breast with nipple shield, 10 with bottle)  LATCH Score/Interventions Latch: Repeated attempts needed to sustain latch, nipple held in mouth throughout feeding, stimulation needed to elicit sucking reflex. (tried 20 nipple shield, short burst of deep latch and good sucking rhythym, but baby then fussy, offerred bottle EBm) Intervention(s): Adjust position;Assist with latch  Audible Swallowing: None  Type of Nipple: Everted at rest and after stimulation  Comfort (Breast/Nipple): Soft / non-tender  Problem noted: Filling Interventions (Mild/moderate discomfort): Post-pump (mom shown how to use her spectra DEP)  Hold  (Positioning): Assistance needed to correctly position infant at breast and maintain latch. Intervention(s): Breastfeeding basics reviewed;Support Pillows;Position options;Skin to skin  LATCH Score: 6  Lactation Tools Discussed/Used Tools: Nipple Shields Nipple shield size: 20;24   Consult Status Consult Status: Follow-up Date: 10/23/16 Follow-up type: Out-patient    Alfred LevinsLee, Ronetta Molla Anne 10/19/2016, 1:44 PM

## 2016-10-19 NOTE — Lactation Note (Addendum)
This note was copied from a baby's chart. Lactation Consultation Note Baby weight 5.15 lbs. W/anterior tight frenulum. Mom's breast filling. MD wants baby to be supplemented after BF. Mom had only supplemented a few times. Discussed reasoning of supplementing. Discussed frenulum issue and transfer from breast. Mom stated she probably will have it fixed.  Mom has large breast w/everted nipples. Nipples intact. Mom stated initial latch hurts then once baby starts suckling gets better. Discussed sometimes when baby's have frenulum issues, it causes trauma to nipples and mom's looks good.  Baby had BF in cradle position for 20 min. Mom's breast are filling. "milk shelf" w/knots, breast heavy. DEBP started. Mom shown how to use DEBP & how to disassemble, clean, & reassemble parts.Mom knows to pump q3h for 15-20 min. Breast massage while pumping to assist in removing knots. Mom pumped 15 ml. Encouraged to pump every 3 hrs, but sooner if needed.  Mom had supplementing information sheet w/amount of hours of age. MD wanting baby to have up to 30 ml. Gave 15 ml colostrum, then gave 10 Alimentum. Took well.  Mom stated that the reason she hasn't supplemented after BF was baby falls asleep and felt like baby must had been satisfied. Encouraged mom to burp, check diaper and supplement. Plan for feeding: BF baby, give colostrum that is pumped from previous feeding, the give Alimentum if needed to equal amount needed.  Mom done teach back for understanding.  Baby is jaundice in appearance. Explained to mom supplementing will help output to excrete bilirubin.  Report given to RN of plan.  Patient Name: Jenna Ward Chattersrin Tremain WUJWJ'XToday's Date: 10/19/2016 Reason for consult: Follow-up assessment;Infant < 6lbs   Maternal Data    Feeding Feeding Type: Breast Milk with Formula added Length of feed: 20 min  LATCH Score/Interventions Latch: Grasps breast easily, tongue down, lips flanged, rhythmical sucking.  Audible  Swallowing: Spontaneous and intermittent Intervention(s): Alternate breast massage  Type of Nipple: Everted at rest and after stimulation  Comfort (Breast/Nipple): Filling, red/small blisters or bruises, mild/mod discomfort  Problem noted: Filling;Mild/Moderate discomfort Interventions (Filling): Massage;Frequent nursing;Double electric pump Interventions (Mild/moderate discomfort): Hand massage;Hand expression;Post-pump  Hold (Positioning): No assistance needed to correctly position infant at breast. Intervention(s): Breastfeeding basics reviewed;Support Pillows;Position options;Skin to skin  LATCH Score: 9  Lactation Tools Discussed/Used Tools: Pump Breast pump type: Double-Electric Breast Pump Pump Review: Setup, frequency, and cleaning;Milk Storage Initiated by:: Peri JeffersonL. Rahm Minix RN IBCLC Date initiated:: 10/19/16   Consult Status Consult Status: Follow-up Date: 10/19/16 (in pm) Follow-up type: In-patient    Jenna Kirk, Diamond NickelLAURA G 10/19/2016, 2:48 AM

## 2016-10-22 ENCOUNTER — Encounter (HOSPITAL_COMMUNITY): Payer: Self-pay | Admitting: *Deleted

## 2016-10-27 ENCOUNTER — Ambulatory Visit: Payer: Self-pay

## 2016-10-27 NOTE — Lactation Note (Addendum)
This note was copied from a baby's chart. Lactation Consult  Mother's reason for visit:  Lactation feeding consult Visit Type:  Feeding assessment Appointment Notes:  See below Consult:  Initial Lactation Consultant:  Ed BlalockSharon S Heston Widener  ________________________________________________________________________ Jenna FloresBaby's Name:  Jenna Kirk Date of Birth:  10/16/2016 Pediatrician:  Dr. Chestine Sporelark Gender:  female Gestational Age: 1751w0d (At Birth) Birth Weight:  6 lb 7.7 oz (2940 g) Weight at Discharge:                          Date of Discharge:   There were no vitals filed for this visit. [facility]  6 lb 3 oz on 7/13      Location: Pediatrician Weight today:  6 lb 9.2 oz (2982 grams) with clean diaper   ________________________________________________________________________  Mother's Name: Jenna Kirk Type of delivery:  C-Section, Low Transverse Breastfeeding Experience:  Going better, breastfeeding all the time, every hour to 3 hours.  Maternal Medical Conditions:  Endometritis currently being treated, edema Maternal Medications:  Amoxicillin BID, completed this am, HCTZ (1 more does to take), BCP for bleeding (progesterone only), Fe, Ibuprofen, PNV  ________________________________________________________________________  Breastfeeding History (Post Discharge)  Frequency of breastfeeding:  every 1-3 hours Duration of feeding:  10-35  Patient does not supplement or pump.- stopped supplementing and pumping last week  Infant Intake and Output Assessment  Voids:  8-10 in 24 hrs.  Color:  Clear yellow Stools:  8-10 in 24 hrs.  Color:  Yellow  ________________________________________________________________________  Maternal Breast Assessment  Breast:  Soft Nipple:  Erect Pain level:  0 Pain interventions:  Bra  _______________________________________________________________________ Feeding  Assessment/Evaluation  Initial feeding assessment:  Infant's oral assessment:  Variance, infant with thick short lingual frenulum  Positioning:  Cradle Left breast  LATCH documentation:  Latch:  2 = Grasps breast easily, tongue down, lips flanged, rhythmical sucking.  Audible swallowing:  2 = Spontaneous and intermittent  Type of nipple:  2 = Everted at rest and after stimulation  Comfort (Breast/Nipple):  2 = Soft / non-tender  Hold (Positioning):  2 = No assistance needed to correctly position infant at breast  LATCH score:  10  Attached assessment:  Deep  Lips flanged:  Yes.    Lips untucked:  No.  Suck assessment:  Displays both   Pre-feed weight:  2982 g  (6 lb. 9.2 oz.) Post-feed weight:  3044 g (6 lb. 11.4 oz.) Amount transferred:  62 ml Amount supplemented:  0 ml  No   Total amount transferred:  62 ml Total supplement given:  0 ml  Mom in for feeding assessment. Mom reports she has been being treated for Endometritis and edema. She is weaning off of her antibiotics and diuretics. Mom is returning to MD to evaluate for retained products tomorrow. She may be facing  D&C. Mom is aware to pump ahead of time for infant and to ask about medications in relation to BF.   Infant latched and fed for 23 minutes transferring 62 ml. Infant needs approximately 55-75 ml/feeding if feeding 8x/day.  Mom is concerned infant is feeding every 1-3 hours, we reviewed growth spurts and easy digestability of BM. Reviewed that infant is gaining well and to keep up the good work. Mom stopped pumping and supplementing last week. Infant is now over birthweight.   Infant with thick short lingual frenulum that mom was aware of. Mom was given tongue  tie Resource sheet. Mom has contact information for Dr. Lexine Baton and plans to call and make an appointment for evaluation. Mom reports her nipples are no longer sore.   We reviewed safe sleep ABC's at mom's asking. We reviewed pumping before and when  returning to work. Mom is not currently pumping and is exclusively BF. Her nipples are no longer sore. Mom without further questions/concerns at this time. Praise mom for her efforts and enc her to keep doing what she is doing!!  Written plan given to mom:  Breast feed infant on demand for as long as she wants Stimulate infant during feeding to maintain active feeding Keep up the awesome work Call with any questions/concerns prn

## 2017-04-03 DIAGNOSIS — L0291 Cutaneous abscess, unspecified: Secondary | ICD-10-CM | POA: Diagnosis not present

## 2017-04-03 DIAGNOSIS — T8189XA Other complications of procedures, not elsewhere classified, initial encounter: Secondary | ICD-10-CM | POA: Diagnosis not present

## 2017-04-08 DIAGNOSIS — Z6829 Body mass index (BMI) 29.0-29.9, adult: Secondary | ICD-10-CM | POA: Diagnosis not present

## 2017-04-08 DIAGNOSIS — Z01419 Encounter for gynecological examination (general) (routine) without abnormal findings: Secondary | ICD-10-CM | POA: Diagnosis not present

## 2017-04-08 DIAGNOSIS — Z124 Encounter for screening for malignant neoplasm of cervix: Secondary | ICD-10-CM | POA: Diagnosis not present

## 2017-04-09 DIAGNOSIS — Z124 Encounter for screening for malignant neoplasm of cervix: Secondary | ICD-10-CM | POA: Diagnosis not present

## 2017-04-09 DIAGNOSIS — F322 Major depressive disorder, single episode, severe without psychotic features: Secondary | ICD-10-CM | POA: Diagnosis not present

## 2017-04-16 DIAGNOSIS — F322 Major depressive disorder, single episode, severe without psychotic features: Secondary | ICD-10-CM | POA: Diagnosis not present

## 2017-04-27 DIAGNOSIS — F322 Major depressive disorder, single episode, severe without psychotic features: Secondary | ICD-10-CM | POA: Diagnosis not present

## 2017-05-05 DIAGNOSIS — H9222 Otorrhagia, left ear: Secondary | ICD-10-CM | POA: Diagnosis not present

## 2017-05-05 DIAGNOSIS — J069 Acute upper respiratory infection, unspecified: Secondary | ICD-10-CM | POA: Diagnosis not present

## 2017-05-08 DIAGNOSIS — F322 Major depressive disorder, single episode, severe without psychotic features: Secondary | ICD-10-CM | POA: Diagnosis not present

## 2017-05-13 DIAGNOSIS — R350 Frequency of micturition: Secondary | ICD-10-CM | POA: Diagnosis not present

## 2017-05-21 DIAGNOSIS — F322 Major depressive disorder, single episode, severe without psychotic features: Secondary | ICD-10-CM | POA: Diagnosis not present

## 2017-06-02 DIAGNOSIS — E042 Nontoxic multinodular goiter: Secondary | ICD-10-CM | POA: Diagnosis not present

## 2017-06-02 DIAGNOSIS — Z8639 Personal history of other endocrine, nutritional and metabolic disease: Secondary | ICD-10-CM | POA: Diagnosis not present

## 2017-06-02 DIAGNOSIS — R6889 Other general symptoms and signs: Secondary | ICD-10-CM | POA: Diagnosis not present

## 2017-06-02 DIAGNOSIS — Z862 Personal history of diseases of the blood and blood-forming organs and certain disorders involving the immune mechanism: Secondary | ICD-10-CM | POA: Diagnosis not present

## 2017-06-02 DIAGNOSIS — R5383 Other fatigue: Secondary | ICD-10-CM | POA: Diagnosis not present

## 2017-06-02 DIAGNOSIS — Z8632 Personal history of gestational diabetes: Secondary | ICD-10-CM | POA: Diagnosis not present

## 2017-06-04 DIAGNOSIS — F322 Major depressive disorder, single episode, severe without psychotic features: Secondary | ICD-10-CM | POA: Diagnosis not present

## 2017-06-18 DIAGNOSIS — F322 Major depressive disorder, single episode, severe without psychotic features: Secondary | ICD-10-CM | POA: Diagnosis not present

## 2017-06-30 ENCOUNTER — Encounter: Payer: Self-pay | Admitting: Allergy and Immunology

## 2017-06-30 ENCOUNTER — Ambulatory Visit: Payer: BLUE CROSS/BLUE SHIELD | Admitting: Allergy and Immunology

## 2017-06-30 VITALS — BP 110/68 | HR 82 | Temp 97.5°F | Resp 20 | Ht 67.2 in | Wt 201.2 lb

## 2017-06-30 DIAGNOSIS — Z881 Allergy status to other antibiotic agents status: Secondary | ICD-10-CM | POA: Diagnosis not present

## 2017-06-30 NOTE — Patient Instructions (Signed)
Drug allergy, antibiotic The patient's history suggests drug allergy to cephalexin.  First and second generation cephalosporins, such as cephalexin, have an R1 side chain similar to that of penicillin, therefore there is a greater risk of penicillin allergy in patients with hypersensitivity to first and second generation cephalosporins than with later generation cephalosporins.  It is unclear if the patient's symptom constellation in late January was related to the amoxicillin, perhaps via a serum sickness like reaction, or a short-lived viral syndrome. A challenge would be required to clarify this issue. Given availability of other classes of antibiotic in context of her high probability of cephalexin hypersensitivity, she should avoid first and second generation cephalosporins as well as penicillin.

## 2017-06-30 NOTE — Progress Notes (Signed)
New Patient Note  RE: Jenna Flockrin Michelle Wrubel MRN: [id number] DOB: September 13, 1975 Date of Office Visit: 06/30/2017  Referring provider: Maurice SmallGriffin, Elaine, MD Primary care provider: Maurice SmallGriffin, Elaine, MD  Chief Complaint: Allergic Reaction   History of present illness: Jenna Kirk is a 42 y.o. female seen today in consultation requested by Maurice SmallElaine Griffin, MD.  She reports that several years ago she took cephalexin and "almost immediately" developed a rash on her upper extremities.  The cephalexin was discontinued and the rash resolved.  In late January 2019, she was started on a course of amoxicillin for presumed otitis media.  3 or 4 days after starting the amoxicillin she developed sore throat, myalgias, and headache.  She called her primary care physician's office and was told to discontinue the amoxicillin.  She states that after stopping the amoxicillin the body aches, sore throat, and headache resolved.  At that time, she was not tested for flu or strep throat.  She states that she did receive the influenza vaccine this year.   Assessment and plan: Drug allergy, antibiotic The patient's history suggests drug allergy to cephalexin.  First and second generation cephalosporins, such as cephalexin, have an R1 side chain similar to that of penicillin, therefore there is a greater risk of penicillin allergy in patients with hypersensitivity to first and second generation cephalosporins than with later generation cephalosporins.  It is unclear if the patient's symptom constellation in late January was related to the amoxicillin, perhaps via a serum sickness like reaction, or a short-lived viral syndrome. A challenge would be required to clarify this issue. Given availability of other classes of antibiotic in context of her high probability of cephalexin hypersensitivity, she should avoid first and second generation cephalosporins as well as penicillin.    Physical examination: Blood  pressure 110/68, pulse 82, temperature (!) 97.5 F (36.4 C), temperature source Oral, resp. rate 20, height 5' 7.2" (1.707 m), weight 201 lb 3.2 oz (91.3 kg), unknown if currently breastfeeding.  General: Alert, interactive, in no acute distress. Neck: Supple without lymphadenopathy. Lungs: Clear to auscultation without wheezing, rhonchi or rales. CV: Normal S1, S2 without murmurs. Abdomen: Nondistended, nontender. Skin: Warm and dry, without lesions or rashes. Extremities:  No clubbing, cyanosis or edema. Neuro:   Grossly intact.  Review of systems:  Review of systems negative except as noted in HPI / PMHx or noted below: Review of Systems  Constitutional: Negative.   HENT: Negative.   Eyes: Negative.   Respiratory: Negative.   Cardiovascular: Negative.   Gastrointestinal: Negative.   Genitourinary: Negative.   Musculoskeletal: Negative.   Skin: Negative.   Neurological: Negative.   Endo/Heme/Allergies: Negative.   Psychiatric/Behavioral: Negative.     Past medical history:  Past Medical History:  Diagnosis Date  . Anxiety   . Fibromyalgia   . Gestational diabetes    glyburide  . Hx of varicella   . Scheuermann disease     Past surgical history:  Past Surgical History:  Procedure Laterality Date  . CESAREAN SECTION    . CESAREAN SECTION N/A 10/16/2016   Procedure: CESAREAN SECTION;  Surgeon: Jaymes Graffillard, Naima, MD;  Location: [facility];  Service: Obstetrics;  Laterality: N/A;  Odelia GageHeather K, RNFA  . MOUTH SURGERY      Family history: Family History  Problem Relation Age of Onset  . Thyroid disease Mother   . Thyroid disease Father   . Stroke Maternal Grandmother   . Aneurysm Maternal Grandfather     Social history:  Social History   Socioeconomic History  . Marital status: Married    Spouse name: Not on file  . Number of children: Not on file  . Years of education: Not on file  . Highest education level: Not on file  Social Needs  . Financial  resource strain: Not on file  . Food insecurity - worry: Not on file  . Food insecurity - inability: Not on file  . Transportation needs - medical: Not on file  . Transportation needs - non-medical: Not on file  Occupational History  . Not on file  Tobacco Use  . Smoking status: Never Smoker  . Smokeless tobacco: Never Used  Substance and Sexual Activity  . Alcohol use: No  . Drug use: No  . Sexual activity: Not on file  Other Topics Concern  . Not on file  Social History Narrative  . Not on file   Environmental History: The patient lives in a house with carpeting in the bedroom and central air/heat.  She is a non-smoker without pets.  There is no known mold/water damage in the home.  Allergies as of 06/30/2017      Reactions   Keflex [cephalexin] Rash   Other Rash   Dodecyl gallate - allergic contact dermatitis      Medication List        Accurate as of 06/30/17  1:31 PM. Always use your most recent med list.          ibuprofen 600 MG tablet Commonly known as:  ADVIL,MOTRIN Take 1 tablet (600 mg total) by mouth every 6 (six) hours.   ONE-A-DAY WOMENS PRENATAL 1 PO Take 1 tablet by mouth daily.   sertraline 50 MG tablet Commonly known as:  ZOLOFT Take 50 mg by mouth daily.       Known medication allergies: Allergies  Allergen Reactions  . Keflex [Cephalexin] Rash  . Other Rash    Dodecyl gallate - allergic contact dermatitis    I appreciate the opportunity to take part in Cherri's care. Please do not hesitate to contact me with questions.  Sincerely,   R. Jorene Guest, MD

## 2017-06-30 NOTE — Assessment & Plan Note (Signed)
The patient's history suggests drug allergy to cephalexin.  First and second generation cephalosporins, such as cephalexin, have an R1 side chain similar to that of penicillin, therefore there is a greater risk of penicillin allergy in patients with hypersensitivity to first and second generation cephalosporins than with later generation cephalosporins.  It is unclear if the patient's symptom constellation in late January was related to the amoxicillin, perhaps via a serum sickness like reaction, or a short-lived viral syndrome. A challenge would be required to clarify this issue. Given availability of other classes of antibiotic in context of her high probability of cephalexin hypersensitivity, she should avoid first and second generation cephalosporins as well as penicillin.

## 2017-07-02 DIAGNOSIS — F322 Major depressive disorder, single episode, severe without psychotic features: Secondary | ICD-10-CM | POA: Diagnosis not present

## 2017-07-16 DIAGNOSIS — F322 Major depressive disorder, single episode, severe without psychotic features: Secondary | ICD-10-CM | POA: Diagnosis not present

## 2017-07-30 DIAGNOSIS — F322 Major depressive disorder, single episode, severe without psychotic features: Secondary | ICD-10-CM | POA: Diagnosis not present

## 2017-08-13 DIAGNOSIS — F322 Major depressive disorder, single episode, severe without psychotic features: Secondary | ICD-10-CM | POA: Diagnosis not present

## 2017-08-27 DIAGNOSIS — F322 Major depressive disorder, single episode, severe without psychotic features: Secondary | ICD-10-CM | POA: Diagnosis not present

## 2017-09-10 DIAGNOSIS — F322 Major depressive disorder, single episode, severe without psychotic features: Secondary | ICD-10-CM | POA: Diagnosis not present

## 2017-09-27 DIAGNOSIS — J0301 Acute recurrent streptococcal tonsillitis: Secondary | ICD-10-CM | POA: Diagnosis not present

## 2017-10-08 DIAGNOSIS — F322 Major depressive disorder, single episode, severe without psychotic features: Secondary | ICD-10-CM | POA: Diagnosis not present

## 2017-10-16 DIAGNOSIS — J019 Acute sinusitis, unspecified: Secondary | ICD-10-CM | POA: Diagnosis not present

## 2017-10-19 DIAGNOSIS — Z136 Encounter for screening for cardiovascular disorders: Secondary | ICD-10-CM | POA: Diagnosis not present

## 2017-10-19 DIAGNOSIS — E042 Nontoxic multinodular goiter: Secondary | ICD-10-CM | POA: Diagnosis not present

## 2017-10-19 DIAGNOSIS — Z131 Encounter for screening for diabetes mellitus: Secondary | ICD-10-CM | POA: Diagnosis not present

## 2017-10-19 DIAGNOSIS — Z0000 Encounter for general adult medical examination without abnormal findings: Secondary | ICD-10-CM | POA: Diagnosis not present

## 2017-10-22 DIAGNOSIS — F322 Major depressive disorder, single episode, severe without psychotic features: Secondary | ICD-10-CM | POA: Diagnosis not present

## 2017-11-05 DIAGNOSIS — F322 Major depressive disorder, single episode, severe without psychotic features: Secondary | ICD-10-CM | POA: Diagnosis not present

## 2017-11-19 DIAGNOSIS — F322 Major depressive disorder, single episode, severe without psychotic features: Secondary | ICD-10-CM | POA: Diagnosis not present

## 2017-11-26 DIAGNOSIS — M722 Plantar fascial fibromatosis: Secondary | ICD-10-CM | POA: Diagnosis not present

## 2017-12-03 DIAGNOSIS — F322 Major depressive disorder, single episode, severe without psychotic features: Secondary | ICD-10-CM | POA: Diagnosis not present

## 2017-12-17 DIAGNOSIS — F322 Major depressive disorder, single episode, severe without psychotic features: Secondary | ICD-10-CM | POA: Diagnosis not present

## 2017-12-18 DIAGNOSIS — Z713 Dietary counseling and surveillance: Secondary | ICD-10-CM | POA: Diagnosis not present

## 2017-12-21 DIAGNOSIS — M19072 Primary osteoarthritis, left ankle and foot: Secondary | ICD-10-CM | POA: Diagnosis not present

## 2017-12-21 DIAGNOSIS — M47816 Spondylosis without myelopathy or radiculopathy, lumbar region: Secondary | ICD-10-CM | POA: Diagnosis not present

## 2017-12-21 DIAGNOSIS — M19041 Primary osteoarthritis, right hand: Secondary | ICD-10-CM | POA: Diagnosis not present

## 2017-12-21 DIAGNOSIS — M19071 Primary osteoarthritis, right ankle and foot: Secondary | ICD-10-CM | POA: Diagnosis not present

## 2017-12-21 DIAGNOSIS — M79673 Pain in unspecified foot: Secondary | ICD-10-CM | POA: Diagnosis not present

## 2017-12-21 DIAGNOSIS — M2550 Pain in unspecified joint: Secondary | ICD-10-CM | POA: Diagnosis not present

## 2017-12-21 DIAGNOSIS — D8989 Other specified disorders involving the immune mechanism, not elsewhere classified: Secondary | ICD-10-CM | POA: Diagnosis not present

## 2017-12-21 DIAGNOSIS — M791 Myalgia, unspecified site: Secondary | ICD-10-CM | POA: Diagnosis not present

## 2017-12-21 DIAGNOSIS — M19042 Primary osteoarthritis, left hand: Secondary | ICD-10-CM | POA: Diagnosis not present

## 2017-12-21 DIAGNOSIS — M79641 Pain in right hand: Secondary | ICD-10-CM | POA: Diagnosis not present

## 2017-12-21 DIAGNOSIS — M533 Sacrococcygeal disorders, not elsewhere classified: Secondary | ICD-10-CM | POA: Diagnosis not present

## 2017-12-21 DIAGNOSIS — M79642 Pain in left hand: Secondary | ICD-10-CM | POA: Diagnosis not present

## 2017-12-21 DIAGNOSIS — M47812 Spondylosis without myelopathy or radiculopathy, cervical region: Secondary | ICD-10-CM | POA: Diagnosis not present

## 2017-12-21 DIAGNOSIS — M549 Dorsalgia, unspecified: Secondary | ICD-10-CM | POA: Diagnosis not present

## 2017-12-21 DIAGNOSIS — M79643 Pain in unspecified hand: Secondary | ICD-10-CM | POA: Diagnosis not present

## 2017-12-31 DIAGNOSIS — F322 Major depressive disorder, single episode, severe without psychotic features: Secondary | ICD-10-CM | POA: Diagnosis not present

## 2018-01-05 DIAGNOSIS — R5383 Other fatigue: Secondary | ICD-10-CM | POA: Diagnosis not present

## 2018-01-05 DIAGNOSIS — M79643 Pain in unspecified hand: Secondary | ICD-10-CM | POA: Diagnosis not present

## 2018-01-05 DIAGNOSIS — M2550 Pain in unspecified joint: Secondary | ICD-10-CM | POA: Diagnosis not present

## 2018-01-05 DIAGNOSIS — M549 Dorsalgia, unspecified: Secondary | ICD-10-CM | POA: Diagnosis not present

## 2018-01-07 DIAGNOSIS — R945 Abnormal results of liver function studies: Secondary | ICD-10-CM | POA: Diagnosis not present

## 2018-01-07 DIAGNOSIS — R160 Hepatomegaly, not elsewhere classified: Secondary | ICD-10-CM | POA: Diagnosis not present

## 2018-01-07 DIAGNOSIS — Z713 Dietary counseling and surveillance: Secondary | ICD-10-CM | POA: Diagnosis not present

## 2018-01-13 DIAGNOSIS — L821 Other seborrheic keratosis: Secondary | ICD-10-CM | POA: Diagnosis not present

## 2018-01-13 DIAGNOSIS — D225 Melanocytic nevi of trunk: Secondary | ICD-10-CM | POA: Diagnosis not present

## 2018-01-13 DIAGNOSIS — D1801 Hemangioma of skin and subcutaneous tissue: Secondary | ICD-10-CM | POA: Diagnosis not present

## 2018-01-13 DIAGNOSIS — L218 Other seborrheic dermatitis: Secondary | ICD-10-CM | POA: Diagnosis not present

## 2018-01-14 DIAGNOSIS — F322 Major depressive disorder, single episode, severe without psychotic features: Secondary | ICD-10-CM | POA: Diagnosis not present

## 2018-01-27 DIAGNOSIS — K754 Autoimmune hepatitis: Secondary | ICD-10-CM | POA: Diagnosis not present

## 2018-01-28 DIAGNOSIS — F322 Major depressive disorder, single episode, severe without psychotic features: Secondary | ICD-10-CM | POA: Diagnosis not present

## 2018-02-02 DIAGNOSIS — M2550 Pain in unspecified joint: Secondary | ICD-10-CM | POA: Diagnosis not present

## 2018-02-02 DIAGNOSIS — R5383 Other fatigue: Secondary | ICD-10-CM | POA: Diagnosis not present

## 2018-02-02 DIAGNOSIS — M549 Dorsalgia, unspecified: Secondary | ICD-10-CM | POA: Diagnosis not present

## 2018-02-02 DIAGNOSIS — M79643 Pain in unspecified hand: Secondary | ICD-10-CM | POA: Diagnosis not present

## 2018-02-03 DIAGNOSIS — J0140 Acute pansinusitis, unspecified: Secondary | ICD-10-CM | POA: Diagnosis not present

## 2018-02-03 DIAGNOSIS — J029 Acute pharyngitis, unspecified: Secondary | ICD-10-CM | POA: Diagnosis not present

## 2018-02-05 DIAGNOSIS — Z713 Dietary counseling and surveillance: Secondary | ICD-10-CM | POA: Diagnosis not present

## 2018-02-08 DIAGNOSIS — J0100 Acute maxillary sinusitis, unspecified: Secondary | ICD-10-CM | POA: Diagnosis not present

## 2018-02-08 DIAGNOSIS — R05 Cough: Secondary | ICD-10-CM | POA: Diagnosis not present

## 2018-02-09 ENCOUNTER — Other Ambulatory Visit: Payer: Self-pay | Admitting: Nurse Practitioner

## 2018-02-09 DIAGNOSIS — K754 Autoimmune hepatitis: Secondary | ICD-10-CM

## 2018-02-11 DIAGNOSIS — F322 Major depressive disorder, single episode, severe without psychotic features: Secondary | ICD-10-CM | POA: Diagnosis not present

## 2018-02-15 ENCOUNTER — Other Ambulatory Visit: Payer: Self-pay

## 2018-02-15 DIAGNOSIS — D224 Melanocytic nevi of scalp and neck: Secondary | ICD-10-CM | POA: Diagnosis not present

## 2018-02-15 DIAGNOSIS — L739 Follicular disorder, unspecified: Secondary | ICD-10-CM | POA: Diagnosis not present

## 2018-02-15 DIAGNOSIS — D485 Neoplasm of uncertain behavior of skin: Secondary | ICD-10-CM | POA: Diagnosis not present

## 2018-03-08 ENCOUNTER — Other Ambulatory Visit: Payer: BLUE CROSS/BLUE SHIELD

## 2018-03-08 ENCOUNTER — Ambulatory Visit
Admission: RE | Admit: 2018-03-08 | Discharge: 2018-03-08 | Disposition: A | Discharge status: Home / Self Care | Payer: BLUE CROSS/BLUE SHIELD | Source: Ambulatory Visit | Attending: Nurse Practitioner | Admitting: Nurse Practitioner

## 2018-03-08 DIAGNOSIS — K754 Autoimmune hepatitis: Secondary | ICD-10-CM

## 2018-03-08 DIAGNOSIS — K8020 Calculus of gallbladder without cholecystitis without obstruction: Secondary | ICD-10-CM | POA: Diagnosis not present

## 2018-03-08 DIAGNOSIS — N281 Cyst of kidney, acquired: Secondary | ICD-10-CM | POA: Diagnosis not present

## 2018-03-09 DIAGNOSIS — F322 Major depressive disorder, single episode, severe without psychotic features: Secondary | ICD-10-CM | POA: Diagnosis not present

## 2018-03-15 DIAGNOSIS — R748 Abnormal levels of other serum enzymes: Secondary | ICD-10-CM | POA: Diagnosis not present

## 2018-03-15 DIAGNOSIS — R768 Other specified abnormal immunological findings in serum: Secondary | ICD-10-CM | POA: Diagnosis not present

## 2018-03-19 DIAGNOSIS — J069 Acute upper respiratory infection, unspecified: Secondary | ICD-10-CM | POA: Diagnosis not present

## 2018-03-24 DIAGNOSIS — M2550 Pain in unspecified joint: Secondary | ICD-10-CM | POA: Diagnosis not present

## 2018-03-24 DIAGNOSIS — M549 Dorsalgia, unspecified: Secondary | ICD-10-CM | POA: Diagnosis not present

## 2018-03-24 DIAGNOSIS — R5383 Other fatigue: Secondary | ICD-10-CM | POA: Diagnosis not present

## 2018-03-24 DIAGNOSIS — M79643 Pain in unspecified hand: Secondary | ICD-10-CM | POA: Diagnosis not present

## 2018-03-25 DIAGNOSIS — F322 Major depressive disorder, single episode, severe without psychotic features: Secondary | ICD-10-CM | POA: Diagnosis not present

## 2018-03-30 DIAGNOSIS — Z713 Dietary counseling and surveillance: Secondary | ICD-10-CM | POA: Diagnosis not present

## 2018-04-22 DIAGNOSIS — F322 Major depressive disorder, single episode, severe without psychotic features: Secondary | ICD-10-CM | POA: Diagnosis not present

## 2018-04-28 DIAGNOSIS — Z3040 Encounter for surveillance of contraceptives, unspecified: Secondary | ICD-10-CM | POA: Diagnosis not present

## 2018-04-28 DIAGNOSIS — Z1231 Encounter for screening mammogram for malignant neoplasm of breast: Secondary | ICD-10-CM | POA: Diagnosis not present

## 2018-04-28 DIAGNOSIS — Z6829 Body mass index (BMI) 29.0-29.9, adult: Secondary | ICD-10-CM | POA: Diagnosis not present

## 2018-04-28 DIAGNOSIS — Z01419 Encounter for gynecological examination (general) (routine) without abnormal findings: Secondary | ICD-10-CM | POA: Diagnosis not present

## 2018-05-06 DIAGNOSIS — F322 Major depressive disorder, single episode, severe without psychotic features: Secondary | ICD-10-CM | POA: Diagnosis not present

## 2018-05-17 DIAGNOSIS — R921 Mammographic calcification found on diagnostic imaging of breast: Secondary | ICD-10-CM | POA: Diagnosis not present

## 2018-06-03 DIAGNOSIS — F322 Major depressive disorder, single episode, severe without psychotic features: Secondary | ICD-10-CM | POA: Diagnosis not present

## 2018-06-04 DIAGNOSIS — Z8632 Personal history of gestational diabetes: Secondary | ICD-10-CM | POA: Diagnosis not present

## 2018-06-04 DIAGNOSIS — E041 Nontoxic single thyroid nodule: Secondary | ICD-10-CM | POA: Diagnosis not present

## 2018-06-04 DIAGNOSIS — Z8639 Personal history of other endocrine, nutritional and metabolic disease: Secondary | ICD-10-CM | POA: Diagnosis not present

## 2018-06-11 DIAGNOSIS — Z713 Dietary counseling and surveillance: Secondary | ICD-10-CM | POA: Diagnosis not present

## 2018-06-24 DIAGNOSIS — N390 Urinary tract infection, site not specified: Secondary | ICD-10-CM | POA: Diagnosis not present

## 2018-06-24 DIAGNOSIS — R5383 Other fatigue: Secondary | ICD-10-CM | POA: Diagnosis not present

## 2018-06-24 DIAGNOSIS — M2550 Pain in unspecified joint: Secondary | ICD-10-CM | POA: Diagnosis not present

## 2018-06-24 DIAGNOSIS — R768 Other specified abnormal immunological findings in serum: Secondary | ICD-10-CM | POA: Diagnosis not present

## 2018-06-24 DIAGNOSIS — M549 Dorsalgia, unspecified: Secondary | ICD-10-CM | POA: Diagnosis not present

## 2018-06-24 DIAGNOSIS — M79643 Pain in unspecified hand: Secondary | ICD-10-CM | POA: Diagnosis not present

## 2018-07-01 DIAGNOSIS — Z113 Encounter for screening for infections with a predominantly sexual mode of transmission: Secondary | ICD-10-CM | POA: Diagnosis not present

## 2018-07-01 DIAGNOSIS — N926 Irregular menstruation, unspecified: Secondary | ICD-10-CM | POA: Diagnosis not present

## 2018-07-01 DIAGNOSIS — Z3040 Encounter for surveillance of contraceptives, unspecified: Secondary | ICD-10-CM | POA: Diagnosis not present

## 2018-07-29 DIAGNOSIS — F322 Major depressive disorder, single episode, severe without psychotic features: Secondary | ICD-10-CM | POA: Diagnosis not present

## 2018-08-18 DIAGNOSIS — N926 Irregular menstruation, unspecified: Secondary | ICD-10-CM | POA: Diagnosis not present

## 2018-08-18 DIAGNOSIS — Z3040 Encounter for surveillance of contraceptives, unspecified: Secondary | ICD-10-CM | POA: Diagnosis not present

## 2018-08-18 DIAGNOSIS — Z09 Encounter for follow-up examination after completed treatment for conditions other than malignant neoplasm: Secondary | ICD-10-CM | POA: Diagnosis not present

## 2018-09-09 DIAGNOSIS — F322 Major depressive disorder, single episode, severe without psychotic features: Secondary | ICD-10-CM | POA: Diagnosis not present

## 2018-09-20 ENCOUNTER — Other Ambulatory Visit: Payer: Self-pay | Admitting: Family Medicine

## 2018-09-20 DIAGNOSIS — N281 Cyst of kidney, acquired: Secondary | ICD-10-CM

## 2018-09-23 DIAGNOSIS — F322 Major depressive disorder, single episode, severe without psychotic features: Secondary | ICD-10-CM | POA: Diagnosis not present

## 2018-09-28 ENCOUNTER — Ambulatory Visit
Admission: RE | Admit: 2018-09-28 | Discharge: 2018-09-28 | Disposition: A | Discharge status: Home / Self Care | Payer: BLUE CROSS/BLUE SHIELD | Source: Ambulatory Visit | Attending: Family Medicine | Admitting: Family Medicine

## 2018-09-28 DIAGNOSIS — N281 Cyst of kidney, acquired: Secondary | ICD-10-CM | POA: Diagnosis not present

## 2018-10-07 DIAGNOSIS — F322 Major depressive disorder, single episode, severe without psychotic features: Secondary | ICD-10-CM | POA: Diagnosis not present

## 2018-10-08 ENCOUNTER — Other Ambulatory Visit: Payer: Self-pay | Admitting: Obstetrics and Gynecology

## 2018-10-08 DIAGNOSIS — D259 Leiomyoma of uterus, unspecified: Secondary | ICD-10-CM | POA: Diagnosis not present

## 2018-10-21 DIAGNOSIS — F322 Major depressive disorder, single episode, severe without psychotic features: Secondary | ICD-10-CM | POA: Diagnosis not present

## 2018-10-26 DIAGNOSIS — M549 Dorsalgia, unspecified: Secondary | ICD-10-CM | POA: Diagnosis not present

## 2018-10-26 DIAGNOSIS — R5383 Other fatigue: Secondary | ICD-10-CM | POA: Diagnosis not present

## 2018-10-26 DIAGNOSIS — N390 Urinary tract infection, site not specified: Secondary | ICD-10-CM | POA: Diagnosis not present

## 2018-10-26 DIAGNOSIS — M2550 Pain in unspecified joint: Secondary | ICD-10-CM | POA: Diagnosis not present

## 2018-10-26 DIAGNOSIS — M79643 Pain in unspecified hand: Secondary | ICD-10-CM | POA: Diagnosis not present

## 2018-11-04 DIAGNOSIS — F322 Major depressive disorder, single episode, severe without psychotic features: Secondary | ICD-10-CM | POA: Diagnosis not present

## 2018-12-16 DIAGNOSIS — F322 Major depressive disorder, single episode, severe without psychotic features: Secondary | ICD-10-CM | POA: Diagnosis not present

## 2018-12-29 DIAGNOSIS — N83209 Unspecified ovarian cyst, unspecified side: Secondary | ICD-10-CM | POA: Diagnosis not present

## 2018-12-29 DIAGNOSIS — Z09 Encounter for follow-up examination after completed treatment for conditions other than malignant neoplasm: Secondary | ICD-10-CM | POA: Diagnosis not present

## 2019-01-13 DIAGNOSIS — D225 Melanocytic nevi of trunk: Secondary | ICD-10-CM | POA: Diagnosis not present

## 2019-01-13 DIAGNOSIS — L304 Erythema intertrigo: Secondary | ICD-10-CM | POA: Diagnosis not present

## 2019-01-13 DIAGNOSIS — L821 Other seborrheic keratosis: Secondary | ICD-10-CM | POA: Diagnosis not present

## 2019-01-13 DIAGNOSIS — D1801 Hemangioma of skin and subcutaneous tissue: Secondary | ICD-10-CM | POA: Diagnosis not present

## 2019-01-17 DIAGNOSIS — E669 Obesity, unspecified: Secondary | ICD-10-CM | POA: Diagnosis not present

## 2019-01-17 DIAGNOSIS — M797 Fibromyalgia: Secondary | ICD-10-CM | POA: Diagnosis not present

## 2019-01-17 DIAGNOSIS — M2550 Pain in unspecified joint: Secondary | ICD-10-CM | POA: Diagnosis not present

## 2019-01-17 DIAGNOSIS — R768 Other specified abnormal immunological findings in serum: Secondary | ICD-10-CM | POA: Diagnosis not present

## 2019-01-17 DIAGNOSIS — M1990 Unspecified osteoarthritis, unspecified site: Secondary | ICD-10-CM | POA: Diagnosis not present

## 2019-02-03 DIAGNOSIS — Z13220 Encounter for screening for lipoid disorders: Secondary | ICD-10-CM | POA: Diagnosis not present

## 2019-02-03 DIAGNOSIS — Z8632 Personal history of gestational diabetes: Secondary | ICD-10-CM | POA: Diagnosis not present

## 2019-02-03 DIAGNOSIS — E042 Nontoxic multinodular goiter: Secondary | ICD-10-CM | POA: Diagnosis not present

## 2019-02-03 DIAGNOSIS — E669 Obesity, unspecified: Secondary | ICD-10-CM | POA: Diagnosis not present

## 2019-02-03 DIAGNOSIS — Z0000 Encounter for general adult medical examination without abnormal findings: Secondary | ICD-10-CM | POA: Diagnosis not present

## 2019-02-03 DIAGNOSIS — Z23 Encounter for immunization: Secondary | ICD-10-CM | POA: Diagnosis not present

## 2019-08-03 ENCOUNTER — Other Ambulatory Visit: Payer: Self-pay | Admitting: Obstetrics and Gynecology

## 2019-08-03 DIAGNOSIS — N6489 Other specified disorders of breast: Secondary | ICD-10-CM

## 2019-08-17 ENCOUNTER — Ambulatory Visit
Admission: RE | Admit: 2019-08-17 | Discharge: 2019-08-17 | Disposition: A | Discharge status: Home / Self Care | Payer: BC Managed Care – PPO | Source: Ambulatory Visit | Attending: Obstetrics and Gynecology | Admitting: Obstetrics and Gynecology

## 2019-08-17 ENCOUNTER — Other Ambulatory Visit: Payer: Self-pay

## 2019-08-17 ENCOUNTER — Ambulatory Visit
Admission: RE | Admit: 2019-08-17 | Discharge: 2019-08-17 | Disposition: A | Discharge status: Home / Self Care | Payer: 59 | Source: Ambulatory Visit | Attending: Obstetrics and Gynecology | Admitting: Obstetrics and Gynecology

## 2019-08-17 DIAGNOSIS — N6489 Other specified disorders of breast: Secondary | ICD-10-CM

## 2019-12-19 IMAGING — US US ABDOMEN LIMITED
1 series · 14 of 25 positions shown · non-contrast
Comparison: CT abdomen and pelvis May 15, 2012

CLINICAL DATA: Hepatitis

EXAM:
ULTRASOUND ABDOMEN LIMITED RIGHT UPPER QUADRANT

[Series 1: us abdomen limited · 0.14mm/px · 14 of 49 slices shown]
[im 1/49]
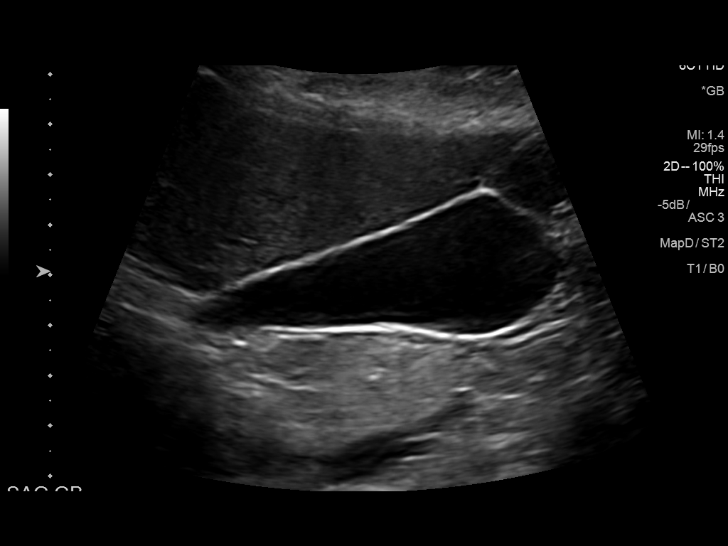
[im 5/49]
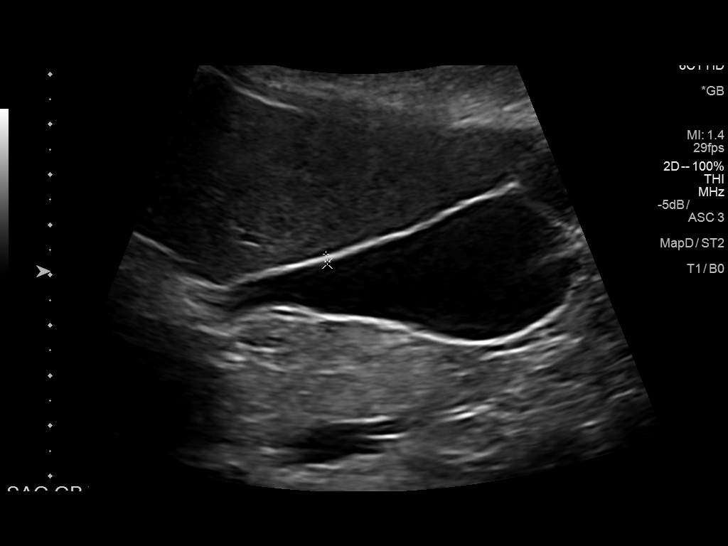
[im 9/49]
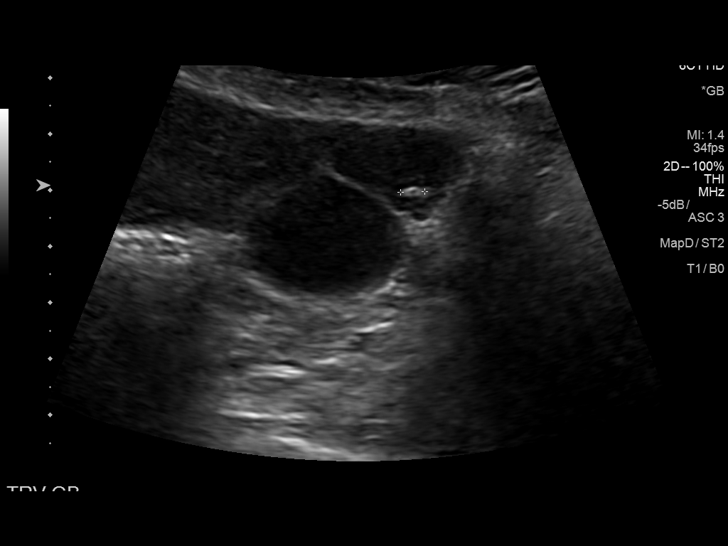
[im 13/49]
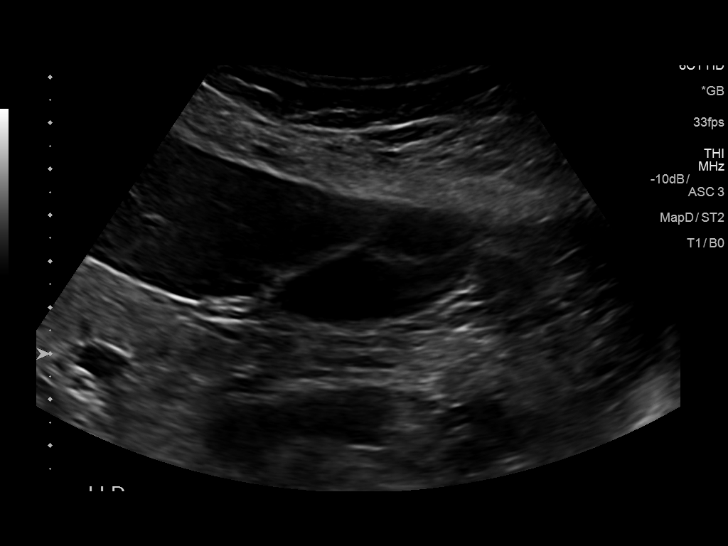
[im 17/49]
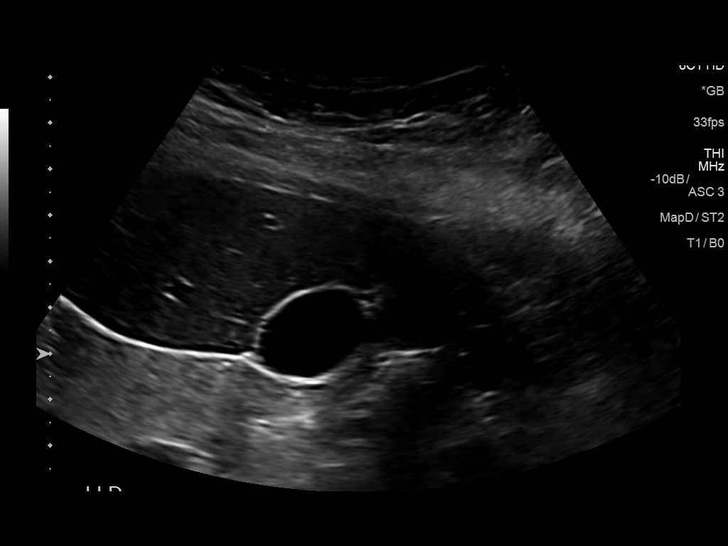
[im 19/49]
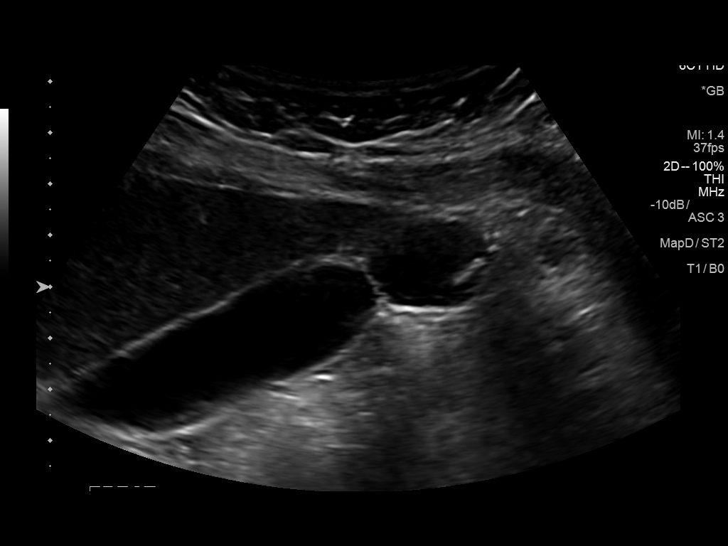
[im 23/49]
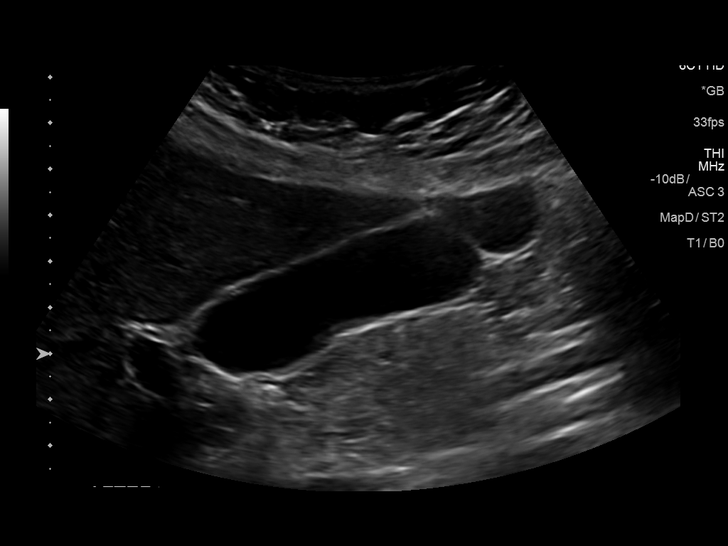
[im 27/49]
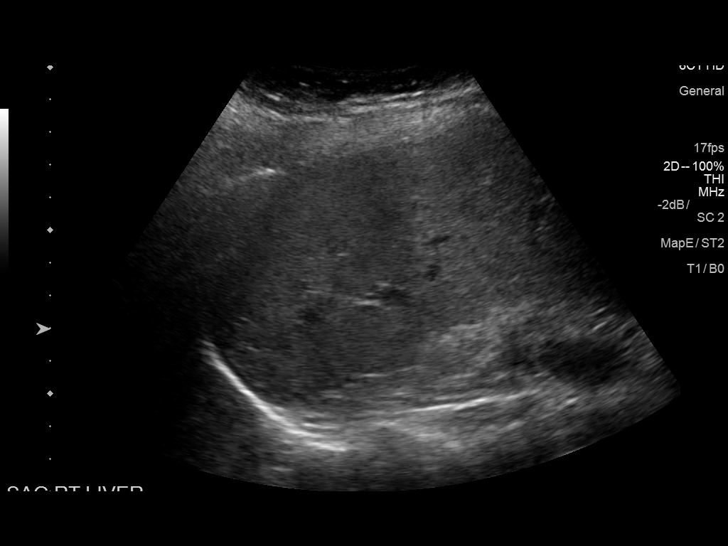
[im 31/49]
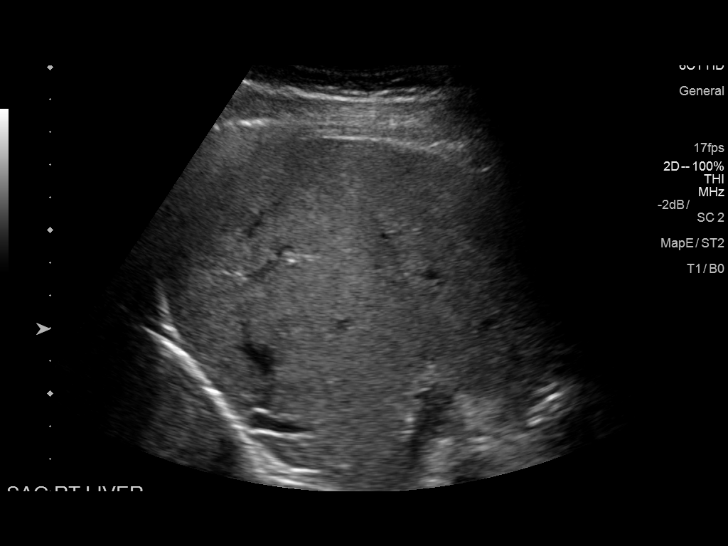
[im 33/49]
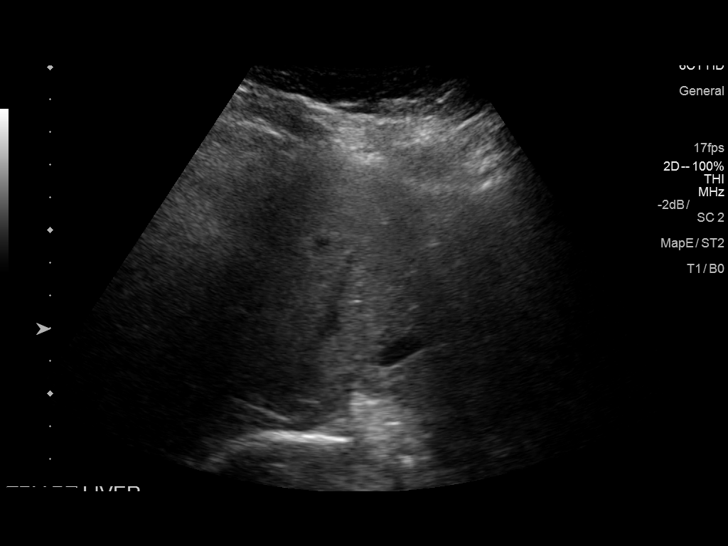
[im 37/49]
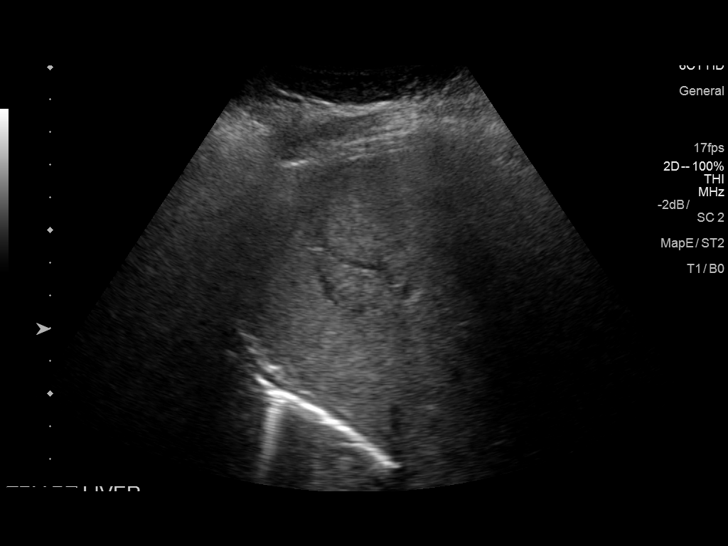
[im 41/49]
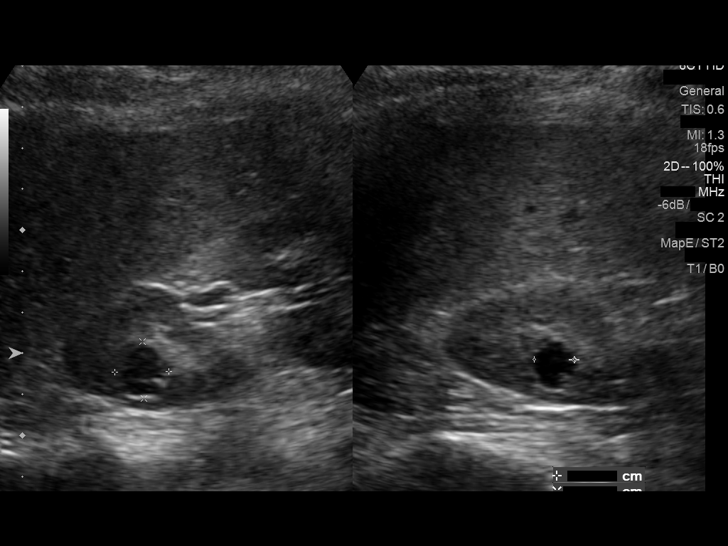
[im 45/49]
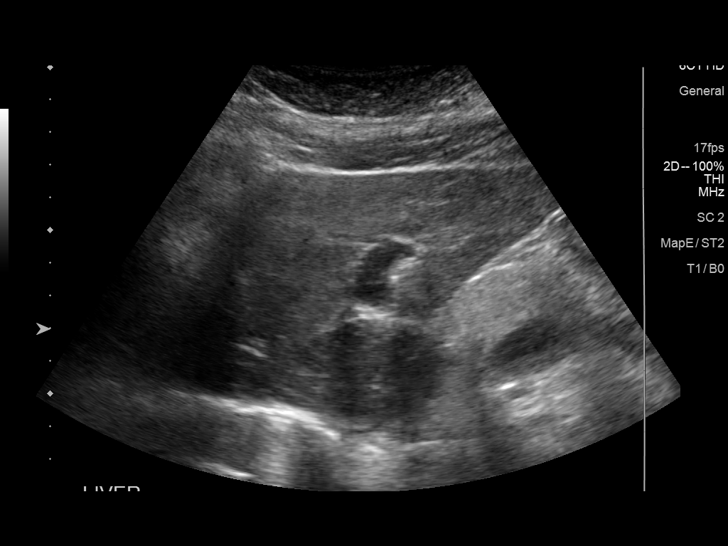
[im 49/49]
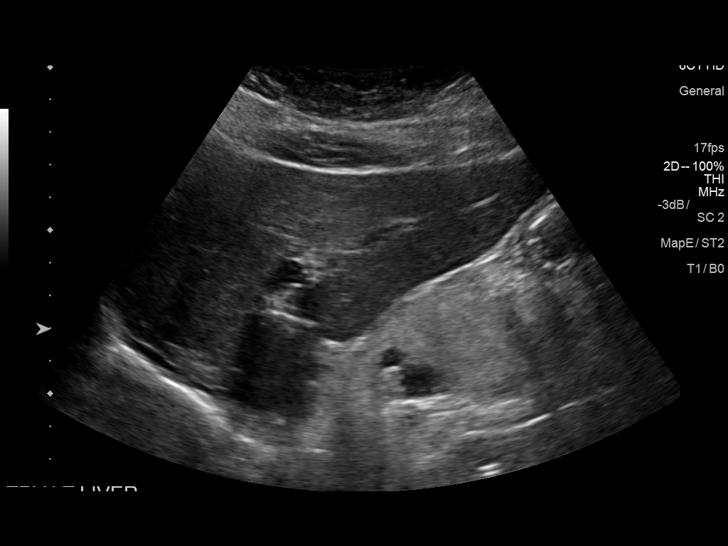

[14 of 25 positions shown; findings below may reference images not displayed]

FINDINGS: Gallbladder:

Within the gallbladder, there are echogenic foci which move and
shadow consistent with cholelithiasis. Largest gallstone measures 4
mm in length. No gallbladder wall thickening or pericholecystic
fluid. No sonographic Murphy sign noted by sonographer.

Common bile duct:

Diameter: 3 mm. No intrahepatic or extrahepatic biliary duct
dilatation.

Liver:

No focal lesion identified. Within normal limits in parenchymal
echogenicity. Portal vein is patent on color Doppler imaging with
normal direction of blood flow towards the liver.

Mildly complex cyst upper pole right kidney 1.3 x 1.4 x 1.0 cm.
IMPRESSION: 1. Cholelithiasis. No gallbladder wall thickening or pericholecystic
fluid.

2.  No focal liver lesions are evident.

3.  Mildly complex cyst upper pole right kidney.

## 2020-02-06 ENCOUNTER — Other Ambulatory Visit: Payer: Self-pay | Admitting: Internal Medicine

## 2020-02-06 DIAGNOSIS — E041 Nontoxic single thyroid nodule: Secondary | ICD-10-CM

## 2020-02-06 DIAGNOSIS — Z8639 Personal history of other endocrine, nutritional and metabolic disease: Secondary | ICD-10-CM

## 2020-02-14 ENCOUNTER — Ambulatory Visit
Admission: RE | Admit: 2020-02-14 | Discharge: 2020-02-14 | Disposition: A | Discharge status: Home / Self Care | Payer: 59 | Source: Ambulatory Visit | Attending: Internal Medicine | Admitting: Internal Medicine

## 2020-02-14 DIAGNOSIS — Z8639 Personal history of other endocrine, nutritional and metabolic disease: Secondary | ICD-10-CM

## 2020-02-14 DIAGNOSIS — E041 Nontoxic single thyroid nodule: Secondary | ICD-10-CM

## 2021-12-04 ENCOUNTER — Encounter: Payer: Self-pay | Admitting: Emergency Medicine

## 2021-12-04 ENCOUNTER — Emergency Department
Admission: EM | Admit: 2021-12-04 | Discharge: 2021-12-05 | Disposition: A | Discharge status: Home / Self Care | Payer: No Typology Code available for payment source | Attending: Emergency Medicine | Admitting: Emergency Medicine

## 2021-12-04 DIAGNOSIS — R1031 Right lower quadrant pain: Secondary | ICD-10-CM | POA: Diagnosis present

## 2021-12-04 DIAGNOSIS — K8050 Calculus of bile duct without cholangitis or cholecystitis without obstruction: Secondary | ICD-10-CM

## 2021-12-04 DIAGNOSIS — R7989 Other specified abnormal findings of blood chemistry: Secondary | ICD-10-CM | POA: Insufficient documentation

## 2021-12-04 DIAGNOSIS — K8020 Calculus of gallbladder without cholecystitis without obstruction: Secondary | ICD-10-CM | POA: Diagnosis not present

## 2021-12-04 LAB — URINALYSIS, ROUTINE W REFLEX MICROSCOPIC
Bilirubin Urine: NEGATIVE
Glucose, UA: NEGATIVE mg/dL
Hgb urine dipstick: NEGATIVE
Ketones, ur: 20 mg/dL — AB
Nitrite: NEGATIVE
Protein, ur: NEGATIVE mg/dL
Specific Gravity, Urine: 1.015 (ref 1.005–1.030)
pH: 7 (ref 5.0–8.0)

## 2021-12-04 LAB — CBC
HCT: 46.4 % — ABNORMAL HIGH (ref 36.0–46.0)
Hemoglobin: 15.7 g/dL — ABNORMAL HIGH (ref 12.0–15.0)
MCH: 30.5 pg (ref 26.0–34.0)
MCHC: 33.8 g/dL (ref 30.0–36.0)
MCV: 90.1 fL (ref 80.0–100.0)
Platelets: 247 10*3/uL (ref 150–400)
RBC: 5.15 MIL/uL — ABNORMAL HIGH (ref 3.87–5.11)
RDW: 12.6 % (ref 11.5–15.5)
WBC: 11.2 10*3/uL — ABNORMAL HIGH (ref 4.0–10.5)
nRBC: 0 % (ref 0.0–0.2)

## 2021-12-04 LAB — COMPREHENSIVE METABOLIC PANEL
ALT: 313 U/L — ABNORMAL HIGH (ref 0–44)
AST: 298 U/L — ABNORMAL HIGH (ref 15–41)
Albumin: 4.3 g/dL (ref 3.5–5.0)
Alkaline Phosphatase: 94 U/L (ref 38–126)
Anion gap: 12 (ref 5–15)
BUN: 17 mg/dL (ref 6–20)
CO2: 24 mmol/L (ref 22–32)
Calcium: 9.4 mg/dL (ref 8.9–10.3)
Chloride: 105 mmol/L (ref 98–111)
Creatinine, Ser: 0.77 mg/dL (ref 0.44–1.00)
GFR, Estimated: >60 mL/min (ref >60)
Glucose, Bld: 102 mg/dL — ABNORMAL HIGH (ref 70–99)
Potassium: 4 mmol/L (ref 3.5–5.1)
Sodium: 141 mmol/L (ref 135–145)
Total Bilirubin: 3.6 mg/dL — ABNORMAL HIGH (ref 0.3–1.2)
Total Protein: 7.6 g/dL (ref 6.5–8.1)

## 2021-12-04 LAB — POC URINE PREG, ED: Preg Test, Ur: NEGATIVE

## 2021-12-04 LAB — LIPASE, BLOOD: Lipase: 43 U/L (ref 11–51)

## 2021-12-04 NOTE — ED Provider Notes (Signed)
[facility] Provider Note    Event Date/Time   First MD Initiated Contact with Patient 12/04/21 2327     (approximate)   History   Abdominal Pain   HPI {Remember to add pertinent medical, surgical, social, and/or OB history to HPI:1} Jenna Kirk is a 46 y.o. female  ***       Physical Exam   Triage Vital Signs: ED Triage Vitals  Enc Vitals Group     BP 12/04/21 1935 109/73     Pulse Rate 12/04/21 1935 79     Resp 12/04/21 1935 18     Temp 12/04/21 1935 98.1 F (36.7 C)     Temp Source 12/04/21 1935 Oral     SpO2 12/04/21 1935 100 %     Weight 12/04/21 1934 74.8 kg (165 lb)     Height 12/04/21 1934 1.715 m (5' 7.5")     Head Circumference --      Peak Flow --      Pain Score --      Pain Loc --      Pain Edu? --      Excl. in GC? --     Most recent vital signs: Vitals:   12/04/21 1935  BP: 109/73  Pulse: 79  Resp: 18  Temp: 98.1 F (36.7 C)  SpO2: 100%    {Only need to document appropriate and relevant physical exam:1} General: Awake, no distress. *** CV:  Good peripheral perfusion. *** Resp:  Normal effort. *** Abd:  No distention. *** Other:  ***   ED Results / Procedures / Treatments   Labs (all labs ordered are listed, but only abnormal results are displayed) Labs Reviewed  COMPREHENSIVE METABOLIC PANEL - Abnormal; Notable for the following components:      Result Value   Glucose, Bld 102 (*)    AST 298 (*)    ALT 313 (*)    Total Bilirubin 3.6 (*)    All other components within normal limits  CBC - Abnormal; Notable for the following components:   WBC 11.2 (*)    RBC 5.15 (*)    Hemoglobin 15.7 (*)    HCT 46.4 (*)    All other components within normal limits  URINALYSIS, ROUTINE W REFLEX MICROSCOPIC - Abnormal; Notable for the following components:   Color, Urine AMBER (*)    APPearance CLOUDY (*)    Ketones, ur 20 (*)    Leukocytes,Ua TRACE (*)    Bacteria, UA RARE (*)    All other  components within normal limits  URINE CULTURE  LIPASE, BLOOD  POC URINE PREG, ED     EKG  ***   RADIOLOGY *** {USE THE WORD "INTERPRETED"!! You MUST document your own interpretation of imaging, as well as the fact that you reviewed the radiologist's report!:1}   PROCEDURES:  Critical Care performed: {CriticalCareYesNo:19197::"Yes, see critical care procedure note(s)","No"}  Procedures   MEDICATIONS ORDERED IN ED: Medications - No data to display   IMPRESSION / MDM / ASSESSMENT AND PLAN / ED COURSE  I reviewed the triage vital signs and the nursing notes.                              Differential diagnosis includes, but is not limited to, ***  Patient's presentation is most consistent with {EM COPA:27473}  {If the patient is on the monitor, remove the brackets and asterisks on the sentence  below and remember to document it as a Procedure as well. Otherwise delete the sentence below:1} {**The patient is on the cardiac monitor to evaluate for evidence of arrhythmia and/or significant heart rate changes.**} {Remember to include, when applicable, any/all of the following data: independent review of imaging independent review of labs (comment specifically on pertinent positives and negatives) review of specific prior hospitalizations, PCP/specialist notes, etc. discuss meds given and prescribed document any discussion with consultants (including hospitalists) any clinical decision tools you used and why (PECARN, NEXUS, etc.) did you consider admitting the patient? document social determinants of health affecting patient's care (homelessness, inability to follow up in a timely fashion, etc) document any pre-existing conditions increasing risk on current visit (e.g. diabetes and HTN increasing danger of high-risk chest pain/ACS) describes what meds you gave (especially parenteral) and why any other interventions?:1}     FINAL CLINICAL IMPRESSION(S) / ED DIAGNOSES    Final diagnoses:  None     Rx / DC Orders   ED Discharge Orders     None        Note:  This document was prepared using Dragon voice recognition software and may include unintentional dictation errors.

## 2021-12-04 NOTE — ED Triage Notes (Signed)
Pt presents via POV with complaints of right sided abdominal and flank pain that started at 2am with associated N/V. No recent surgeries - felt like a "gas bubble" in RLQ. Denies diarrhea, fevers, chills, CP or SOB.

## 2021-12-05 ENCOUNTER — Ambulatory Visit: Payer: Self-pay | Admitting: Surgery

## 2021-12-05 ENCOUNTER — Emergency Department: Payer: No Typology Code available for payment source

## 2021-12-05 ENCOUNTER — Ambulatory Visit (INDEPENDENT_AMBULATORY_CARE_PROVIDER_SITE_OTHER): Payer: No Typology Code available for payment source | Admitting: Surgery

## 2021-12-05 ENCOUNTER — Telehealth: Payer: Self-pay | Admitting: Surgery

## 2021-12-05 DIAGNOSIS — K8010 Calculus of gallbladder with chronic cholecystitis without obstruction: Secondary | ICD-10-CM

## 2021-12-05 DIAGNOSIS — K8020 Calculus of gallbladder without cholecystitis without obstruction: Secondary | ICD-10-CM

## 2021-12-05 MED ORDER — ONDANSETRON HCL 4 MG PO TABS: 4.0000 mg | ORAL_TABLET | Freq: Every day | ORAL | 1 refills | Status: DC | PRN

## 2021-12-05 MED ORDER — DOCUSATE SODIUM 100 MG PO CAPS: ORAL_CAPSULE | ORAL | 0 refills | Status: DC

## 2021-12-05 MED ORDER — IOHEXOL 300 MG/ML  SOLN
100.0000 mL | Freq: Once | INTRAMUSCULAR | Status: AC | PRN
  Administered 2021-12-05: 100 mL via INTRAVENOUS

## 2021-12-05 MED ORDER — OXYCODONE-ACETAMINOPHEN 5-325 MG PO TABS: 2.0000 | ORAL_TABLET | Freq: Four times a day (QID) | ORAL | 0 refills | Status: DC | PRN

## 2021-12-05 NOTE — Telephone Encounter (Signed)
Patient notified MRCP  12/06/21 @ [facility]. Arrive at 11:30 . Nothing to eat/drink 4 hours prior.

## 2021-12-05 NOTE — Telephone Encounter (Signed)
Outgoing call, left message for patient to call, please inform of the following regarding scheduled surgery   Pre-Admission date/time, and Surgery date.  Surgery Date: 12/11/21 @ [facility] 2nd floor  Preadmission Testing Date: 12/09/21 (phone 8a-1p)  Also patient will need to call at [phone], between 1-3:00pm the day before surgery, to find out what time to arrive for surgery.

## 2021-12-05 NOTE — Discharge Instructions (Addendum)
As we discussed, it does not appear you need emergent surgery today, but you will need to follow-up as an outpatient.  Dr. Claudine Mouton will coordinate with his staff and with you to schedule a follow-up appointment, and you can most likely plan on having surgery next week, although they will work out the details with you.  In the meantime, please read through the included information about biliary colic and about how to structure an eating plan to try to avoid recurrent symptoms.  You can use over-the-counter ibuprofen and/or Tylenol for pain control.  Take Percocet as prescribed for severe pain. Do not drink alcohol, drive or participate in any other potentially dangerous activities while taking this medication as it may make you sleepy. Do not take this medication with any other sedating medications, either prescription or over-the-counter. If you were prescribed Percocet or Vicodin, do not take these with acetaminophen (Tylenol) as it is already contained within these medications.   This medication is an opiate (or narcotic) pain medication and can be habit forming.  Use it as little as possible to achieve adequate pain control.  Do not use or use it with extreme caution if you have a history of opiate abuse or dependence.  If you are on a pain contract with your primary care doctor or a pain specialist, be sure to let them know you were prescribed this medication today from the [facility].  This medication is intended for your use only - do not give any to anyone else and keep it in a secure place where nobody else, especially children, have access to it.  It will also cause or worsen constipation, so you may want to consider taking an over-the-counter stool softener while you are taking this medication.  However, if you develop new or worsening symptoms, including but not limited to fever, worsening pain, persistent nausea and vomiting, etc., please return immediately to the  emergency department.

## 2021-12-06 ENCOUNTER — Encounter
Admission: RE | Disposition: A | Discharge status: Home / Self Care | Payer: Self-pay | Source: Ambulatory Visit | Attending: Surgery

## 2021-12-06 ENCOUNTER — Ambulatory Visit
Admission: RE | Admit: 2021-12-06 | Discharge: 2021-12-06 | Disposition: A | Discharge status: Home / Self Care | Payer: No Typology Code available for payment source | Source: Ambulatory Visit | Attending: Surgery | Admitting: Surgery

## 2021-12-06 ENCOUNTER — Other Ambulatory Visit
Admission: RE | Admit: 2021-12-06 | Discharge: 2021-12-06 | Disposition: A | Discharge status: Home / Self Care | Payer: No Typology Code available for payment source | Source: Ambulatory Visit | Attending: Surgery | Admitting: Surgery

## 2021-12-06 ENCOUNTER — Other Ambulatory Visit: Payer: Self-pay

## 2021-12-06 ENCOUNTER — Ambulatory Visit: Payer: No Typology Code available for payment source | Admitting: Registered Nurse

## 2021-12-06 ENCOUNTER — Encounter: Payer: Self-pay | Admitting: Surgery

## 2021-12-06 DIAGNOSIS — Z98891 History of uterine scar from previous surgery: Secondary | ICD-10-CM | POA: Insufficient documentation

## 2021-12-06 DIAGNOSIS — Z8632 Personal history of gestational diabetes: Secondary | ICD-10-CM | POA: Insufficient documentation

## 2021-12-06 DIAGNOSIS — M797 Fibromyalgia: Secondary | ICD-10-CM | POA: Diagnosis not present

## 2021-12-06 DIAGNOSIS — K8010 Calculus of gallbladder with chronic cholecystitis without obstruction: Secondary | ICD-10-CM | POA: Diagnosis not present

## 2021-12-06 DIAGNOSIS — Z8619 Personal history of other infectious and parasitic diseases: Secondary | ICD-10-CM | POA: Diagnosis not present

## 2021-12-06 DIAGNOSIS — K8012 Calculus of gallbladder with acute and chronic cholecystitis without obstruction: Secondary | ICD-10-CM

## 2021-12-06 DIAGNOSIS — M4200 Juvenile osteochondrosis of spine, site unspecified: Secondary | ICD-10-CM | POA: Insufficient documentation

## 2021-12-06 DIAGNOSIS — K8020 Calculus of gallbladder without cholecystitis without obstruction: Secondary | ICD-10-CM

## 2021-12-06 LAB — URINE CULTURE

## 2021-12-06 LAB — COMPREHENSIVE METABOLIC PANEL
ALT: 346 U/L — ABNORMAL HIGH (ref 0–44)
AST: 181 U/L — ABNORMAL HIGH (ref 15–41)
Albumin: 4.2 g/dL (ref 3.5–5.0)
Alkaline Phosphatase: 165 U/L — ABNORMAL HIGH (ref 38–126)
Anion gap: 5 (ref 5–15)
BUN: 12 mg/dL (ref 6–20)
CO2: 23 mmol/L (ref 22–32)
Calcium: 9.3 mg/dL (ref 8.9–10.3)
Chloride: 112 mmol/L — ABNORMAL HIGH (ref 98–111)
Creatinine, Ser: 0.93 mg/dL (ref 0.44–1.00)
GFR, Estimated: >60 mL/min (ref >60)
Glucose, Bld: 116 mg/dL — ABNORMAL HIGH (ref 70–99)
Potassium: 4.2 mmol/L (ref 3.5–5.1)
Sodium: 140 mmol/L (ref 135–145)
Total Bilirubin: 3.5 mg/dL — ABNORMAL HIGH (ref 0.3–1.2)
Total Protein: 7.5 g/dL (ref 6.5–8.1)

## 2021-12-06 LAB — CBC
HCT: 47.5 % — ABNORMAL HIGH (ref 36.0–46.0)
Hemoglobin: 15.9 g/dL — ABNORMAL HIGH (ref 12.0–15.0)
MCH: 30.4 pg (ref 26.0–34.0)
MCHC: 33.5 g/dL (ref 30.0–36.0)
MCV: 90.8 fL (ref 80.0–100.0)
Platelets: 236 10*3/uL (ref 150–400)
RBC: 5.23 MIL/uL — ABNORMAL HIGH (ref 3.87–5.11)
RDW: 13 % (ref 11.5–15.5)
WBC: 5.2 10*3/uL (ref 4.0–10.5)
nRBC: 0 % (ref 0.0–0.2)

## 2021-12-06 LAB — POCT PREGNANCY, URINE: Preg Test, Ur: NEGATIVE

## 2021-12-06 SURGERY — CHOLECYSTECTOMY, ROBOT-ASSISTED, LAPAROSCOPIC
Anesthesia: General

## 2021-12-06 MED ORDER — LIDOCAINE HCL (PF) 2 % IJ SOLN
INTRAMUSCULAR | Status: AC
  Filled 2021-12-06: qty 5

## 2021-12-06 MED ORDER — ONDANSETRON HCL 4 MG/2ML IJ SOLN
INTRAMUSCULAR | Status: AC
  Filled 2021-12-06: qty 2

## 2021-12-06 MED ORDER — DEXMEDETOMIDINE HCL IN NACL 200 MCG/50ML IV SOLN
INTRAVENOUS | Status: DC | PRN
  Administered 2021-12-06: 12 ug via INTRAVENOUS

## 2021-12-06 MED ORDER — FENTANYL CITRATE (PF) 100 MCG/2ML IJ SOLN
INTRAMUSCULAR | Status: AC
  Filled 2021-12-06: qty 2

## 2021-12-06 MED ORDER — EPINEPHRINE PF 1 MG/ML IJ SOLN
INTRAMUSCULAR | Status: AC
  Filled 2021-12-06: qty 1

## 2021-12-06 MED ORDER — SUGAMMADEX SODIUM 200 MG/2ML IV SOLN
INTRAVENOUS | Status: DC | PRN
  Administered 2021-12-06: 200 mg via INTRAVENOUS

## 2021-12-06 MED ORDER — LACTATED RINGERS IV SOLN: INTRAVENOUS | Status: DC

## 2021-12-06 MED ORDER — GABAPENTIN 300 MG PO CAPS: 300.0000 mg | ORAL_CAPSULE | ORAL | Status: AC

## 2021-12-06 MED ORDER — BUPIVACAINE HCL (PF) 0.25 % IJ SOLN
INTRAMUSCULAR | Status: AC
Start: 2021-12-06 — End: ?
  Filled 2021-12-06: qty 30

## 2021-12-06 MED ORDER — ROCURONIUM BROMIDE 100 MG/10ML IV SOLN
INTRAVENOUS | Status: DC | PRN
  Administered 2021-12-06: 60 mg via INTRAVENOUS

## 2021-12-06 MED ORDER — DEXAMETHASONE SODIUM PHOSPHATE 10 MG/ML IJ SOLN
INTRAMUSCULAR | Status: AC
  Filled 2021-12-06: qty 1

## 2021-12-06 MED ORDER — OXYCODONE HCL 5 MG PO TABS
ORAL_TABLET | ORAL | Status: AC
  Administered 2021-12-06: 5 mg via ORAL
  Filled 2021-12-06: qty 1

## 2021-12-06 MED ORDER — MIDAZOLAM HCL 2 MG/2ML IJ SOLN
INTRAMUSCULAR | Status: AC
  Filled 2021-12-06: qty 2

## 2021-12-06 MED ORDER — SEVOFLURANE IN SOLN
RESPIRATORY_TRACT | Status: AC
  Filled 2021-12-06: qty 250

## 2021-12-06 MED ORDER — DEXAMETHASONE SODIUM PHOSPHATE 10 MG/ML IJ SOLN
INTRAMUSCULAR | Status: DC | PRN
  Administered 2021-12-06: 5 mg via INTRAVENOUS

## 2021-12-06 MED ORDER — PROPOFOL 10 MG/ML IV BOLUS
INTRAVENOUS | Status: DC | PRN
  Administered 2021-12-06: 160 mg via INTRAVENOUS

## 2021-12-06 MED ORDER — LIDOCAINE HCL (CARDIAC) PF 100 MG/5ML IV SOSY
PREFILLED_SYRINGE | INTRAVENOUS | Status: DC | PRN
  Administered 2021-12-06: 60 mg via INTRAVENOUS
  Administered 2021-12-06: 100 mg via INTRAVENOUS

## 2021-12-06 MED ORDER — GADOBUTROL 1 MMOL/ML IV SOLN
7.0000 mL | Freq: Once | INTRAVENOUS | Status: AC | PRN
  Administered 2021-12-06: 7 mL via INTRAVENOUS

## 2021-12-06 MED ORDER — ORAL CARE MOUTH RINSE
15.0000 mL | Freq: Once | OROMUCOSAL | Status: AC
Start: 2021-12-06 — End: 2021-12-06

## 2021-12-06 MED ORDER — FENTANYL CITRATE (PF) 100 MCG/2ML IJ SOLN
25.0000 ug | INTRAMUSCULAR | Status: DC | PRN
  Administered 2021-12-06 (×2): 25 ug via INTRAVENOUS

## 2021-12-06 MED ORDER — CHLORHEXIDINE GLUCONATE CLOTH 2 % EX PADS: 6.0000 | MEDICATED_PAD | Freq: Once | CUTANEOUS | Status: DC

## 2021-12-06 MED ORDER — BUPIVACAINE LIPOSOME 1.3 % IJ SUSP: 20.0000 mL | Freq: Once | INTRAMUSCULAR | Status: DC

## 2021-12-06 MED ORDER — MIDAZOLAM HCL 2 MG/2ML IJ SOLN
INTRAMUSCULAR | Status: DC | PRN
  Administered 2021-12-06: 2 mg via INTRAVENOUS

## 2021-12-06 MED ORDER — KETAMINE HCL 50 MG/5ML IJ SOSY
PREFILLED_SYRINGE | INTRAMUSCULAR | Status: AC
  Filled 2021-12-06: qty 5

## 2021-12-06 MED ORDER — KETAMINE HCL 10 MG/ML IJ SOLN
INTRAMUSCULAR | Status: DC | PRN
  Administered 2021-12-06: 10 mg via INTRAVENOUS

## 2021-12-06 MED ORDER — PHENYLEPHRINE 80 MCG/ML (10ML) SYRINGE FOR IV PUSH (FOR BLOOD PRESSURE SUPPORT)
PREFILLED_SYRINGE | INTRAVENOUS | Status: DC | PRN
  Administered 2021-12-06 (×2): 160 ug via INTRAVENOUS

## 2021-12-06 MED ORDER — CHLORHEXIDINE GLUCONATE CLOTH 2 % EX PADS
6.0000 | MEDICATED_PAD | Freq: Once | CUTANEOUS | Status: AC
  Administered 2021-12-06: 6 via TOPICAL

## 2021-12-06 MED ORDER — PHENYLEPHRINE 80 MCG/ML (10ML) SYRINGE FOR IV PUSH (FOR BLOOD PRESSURE SUPPORT)
PREFILLED_SYRINGE | INTRAVENOUS | Status: AC
  Filled 2021-12-06: qty 10

## 2021-12-06 MED ORDER — CIPROFLOXACIN IN D5W 400 MG/200ML IV SOLN
400.0000 mg | INTRAVENOUS | Status: AC
  Administered 2021-12-06: 400 mg via INTRAVENOUS

## 2021-12-06 MED ORDER — ACETAMINOPHEN 10 MG/ML IV SOLN: 1000.0000 mg | Freq: Once | INTRAVENOUS | Status: DC | PRN

## 2021-12-06 MED ORDER — ONDANSETRON HCL 4 MG/2ML IJ SOLN
INTRAMUSCULAR | Status: DC | PRN
  Administered 2021-12-06: 4 mg via INTRAVENOUS

## 2021-12-06 MED ORDER — FENTANYL CITRATE (PF) 100 MCG/2ML IJ SOLN
INTRAMUSCULAR | Status: DC | PRN
  Administered 2021-12-06 (×4): 25 ug via INTRAVENOUS

## 2021-12-06 MED ORDER — ONDANSETRON HCL 4 MG/2ML IJ SOLN: 4.0000 mg | Freq: Once | INTRAMUSCULAR | Status: DC | PRN

## 2021-12-06 MED ORDER — GABAPENTIN 300 MG PO CAPS
ORAL_CAPSULE | ORAL | Status: AC
  Administered 2021-12-06: 300 mg via ORAL
  Filled 2021-12-06: qty 1

## 2021-12-06 MED ORDER — OXYCODONE HCL 5 MG PO TABS: 5.0000 mg | ORAL_TABLET | Freq: Once | ORAL | Status: AC | PRN

## 2021-12-06 MED ORDER — FENTANYL CITRATE (PF) 100 MCG/2ML IJ SOLN
INTRAMUSCULAR | Status: AC
  Administered 2021-12-06: 50 ug via INTRAVENOUS
  Filled 2021-12-06: qty 2

## 2021-12-06 MED ORDER — CHLORHEXIDINE GLUCONATE 0.12 % MT SOLN
OROMUCOSAL | Status: AC
  Administered 2021-12-06: 15 mL via OROMUCOSAL
  Filled 2021-12-06: qty 15

## 2021-12-06 MED ORDER — BUPIVACAINE-EPINEPHRINE (PF) 0.25% -1:200000 IJ SOLN
INTRAMUSCULAR | Status: DC | PRN
  Administered 2021-12-06: 30 mL

## 2021-12-06 MED ORDER — CHLORHEXIDINE GLUCONATE 0.12 % MT SOLN: 15.0000 mL | Freq: Once | OROMUCOSAL | Status: AC

## 2021-12-06 MED ORDER — CIPROFLOXACIN IN D5W 400 MG/200ML IV SOLN
INTRAVENOUS | Status: AC
  Filled 2021-12-06: qty 200

## 2021-12-06 MED ORDER — ACETAMINOPHEN 500 MG PO TABS
ORAL_TABLET | ORAL | Status: AC
  Administered 2021-12-06: 1000 mg via ORAL
  Filled 2021-12-06: qty 2

## 2021-12-06 MED ORDER — PROPOFOL 10 MG/ML IV BOLUS
INTRAVENOUS | Status: AC
  Filled 2021-12-06: qty 20

## 2021-12-06 MED ORDER — OXYCODONE HCL 5 MG/5ML PO SOLN: 5.0000 mg | Freq: Once | ORAL | Status: AC | PRN

## 2021-12-06 MED ORDER — INDOCYANINE GREEN 25 MG IV SOLR
1.2500 mg | Freq: Once | INTRAVENOUS | Status: AC
  Administered 2021-12-06: 1.25 mg via INTRAVENOUS
  Filled 2021-12-06: qty 0.5

## 2021-12-06 MED ORDER — ACETAMINOPHEN 500 MG PO TABS: 1000.0000 mg | ORAL_TABLET | ORAL | Status: AC

## 2021-12-06 SURGICAL SUPPLY — 47 items
ADH SKN CLS APL DERMABOND .7 (GAUZE/BANDAGES/DRESSINGS) ×1
BAG PRESSURE INF REUSE 3000 (BAG) IMPLANT
CLIP LIGATING HEM O LOK PURPLE (MISCELLANEOUS) ×2 IMPLANT
COVER TIP SHEARS 8 DVNC (MISCELLANEOUS) ×2 IMPLANT
COVER TIP SHEARS 8MM DA VINCI (MISCELLANEOUS) ×1
DERMABOND ADVANCED (GAUZE/BANDAGES/DRESSINGS) ×1
DERMABOND ADVANCED .7 DNX12 (GAUZE/BANDAGES/DRESSINGS) ×2 IMPLANT
DRAPE ARM DVNC X/XI (DISPOSABLE) ×8 IMPLANT
DRAPE COLUMN DVNC XI (DISPOSABLE) ×2 IMPLANT
DRAPE DA VINCI XI ARM (DISPOSABLE) ×4
DRAPE DA VINCI XI COLUMN (DISPOSABLE) ×1
ELECT CAUTERY BLADE 6.4 (BLADE) ×2 IMPLANT
GLOVE ORTHO TXT STRL SZ7.5 (GLOVE) ×4 IMPLANT
GOWN STRL REUS W/ TWL LRG LVL3 (GOWN DISPOSABLE) ×4 IMPLANT
GOWN STRL REUS W/ TWL XL LVL3 (GOWN DISPOSABLE) ×4 IMPLANT
GOWN STRL REUS W/TWL LRG LVL3 (GOWN DISPOSABLE) ×2
GOWN STRL REUS W/TWL XL LVL3 (GOWN DISPOSABLE) ×2
GRASPER SUT TROCAR 14GX15 (MISCELLANEOUS) IMPLANT
IRRIGATION STRYKERFLOW (MISCELLANEOUS) IMPLANT
IRRIGATOR STRYKERFLOW (MISCELLANEOUS)
IRRIGATOR SUCT 8 DISP DVNC XI (IRRIGATION / IRRIGATOR) IMPLANT
IRRIGATOR SUCTION 8MM XI DISP (IRRIGATION / IRRIGATOR)
IV NS IRRIG 3000ML ARTHROMATIC (IV SOLUTION) IMPLANT
KIT PINK PAD W/HEAD ARE REST (MISCELLANEOUS) ×1 IMPLANT
KIT PINK PAD W/HEAD ARM REST (MISCELLANEOUS) ×2 IMPLANT
KIT TURNOVER KIT A (KITS) ×2 IMPLANT
LABEL OR SOLS (LABEL) ×2 IMPLANT
MANIFOLD NEPTUNE II (INSTRUMENTS) ×2 IMPLANT
NDL INSUFFLATION 14GA 120MM (NEEDLE) IMPLANT
NEEDLE HYPO 22GX1.5 SAFETY (NEEDLE) ×2 IMPLANT
NEEDLE INSUFFLATION 14GA 120MM (NEEDLE) IMPLANT
NS IRRIG 500ML POUR BTL (IV SOLUTION) ×2 IMPLANT
PACK LAP CHOLECYSTECTOMY (MISCELLANEOUS) ×2 IMPLANT
SEAL CANN UNIV 5-8 DVNC XI (MISCELLANEOUS) ×8 IMPLANT
SEAL XI 5MM-8MM UNIVERSAL (MISCELLANEOUS) ×4
SET TUBE SMOKE EVAC HIGH FLOW (TUBING) ×2 IMPLANT
SOLUTION ELECTROLUBE (MISCELLANEOUS) ×2 IMPLANT
SPIKE FLUID TRANSFER (MISCELLANEOUS) ×2 IMPLANT
SUT MNCRL 4-0 (SUTURE) ×1
SUT MNCRL 4-0 27XMFL (SUTURE) ×1
SUT VICRYL 0 AB UR-6 (SUTURE) ×2 IMPLANT
SUTURE MNCRL 4-0 27XMF (SUTURE) ×2 IMPLANT
SYS BAG RETRIEVAL 10MM (BASKET) ×1
SYSTEM BAG RETRIEVAL 10MM (BASKET) ×2 IMPLANT
TRAP FLUID SMOKE EVACUATOR (MISCELLANEOUS) ×2 IMPLANT
TROCAR Z-THREAD FIOS 11X100 BL (TROCAR) IMPLANT
WATER STERILE IRR 500ML POUR (IV SOLUTION) ×2 IMPLANT

## 2021-12-06 NOTE — Anesthesia Procedure Notes (Signed)
Procedure Name: Intubation Date/Time: 12/06/2021 2:16 PM  Performed by: Lynden Oxford, CRNAPre-anesthesia Checklist: Patient identified, Emergency Drugs available, Suction available and Patient being monitored Patient Re-evaluated:Patient Re-evaluated prior to induction Oxygen Delivery Method: Circle system utilized Preoxygenation: Pre-oxygenation with 100% oxygen Induction Type: IV induction Ventilation: Mask ventilation without difficulty Laryngoscope Size: McGraph and 3 Tube type: Oral Tube size: 7.0 mm Number of attempts: 1 Airway Equipment and Method: Stylet and Oral airway Placement Confirmation: ETT inserted through vocal cords under direct vision, positive ETCO2 and breath sounds checked- equal and bilateral Secured at: 20 cm Tube secured with: Tape Dental Injury: Teeth and Oropharynx as per pre-operative assessment

## 2021-12-06 NOTE — Anesthesia Postprocedure Evaluation (Signed)
Anesthesia Post Note  Patient: Jenna Kirk  Procedure(s) Performed: XI ROBOTIC ASSISTED LAPAROSCOPIC CHOLECYSTECTOMY INDOCYANINE GREEN FLUORESCENCE IMAGING (ICG)  Patient location during evaluation: PACU Level of consciousness: awake and alert, oriented and patient cooperative Pain management: pain level controlled Vital Signs Assessment: post-procedure vital signs reviewed and stable Respiratory status: spontaneous breathing, nonlabored ventilation and respiratory function stable Cardiovascular status: blood pressure returned to baseline and stable Postop Assessment: adequate PO intake Anesthetic complications: no   No notable events documented.   Last Vitals:  Vitals:   12/06/21 1545 12/06/21 1600  BP: 97/63 103/64  Pulse: 64 66  Resp: 12 12  Temp:    SpO2: 92% 98%    Last Pain:  Vitals:   12/06/21 1545  PainSc: 5                  Reed Breech

## 2021-12-06 NOTE — Discharge Instructions (Signed)

## 2021-12-06 NOTE — Interval H&P Note (Signed)
History and Physical Interval Note:  12/06/2021 1:08 PM  Jenna Kirk  has presented today for surgery, with the diagnosis of Calculus cholecystitis.  The various methods of treatment have been discussed with the patient and family. After consideration of risks, benefits and other options for treatment, the patient has consented to  Procedure(s): XI ROBOTIC ASSISTED LAPAROSCOPIC CHOLECYSTECTOMY (N/A) INDOCYANINE GREEN FLUORESCENCE IMAGING (ICG) (N/A) as a surgical intervention.  The patient's history has been reviewed, patient examined, no change in status, stable for surgery.  I have reviewed the patient's chart and labs.  Questions were answered to the patient's satisfaction.     Campbell Lerner

## 2021-12-06 NOTE — H&P (Signed)
Patient ID: Jenna Kirk, female   DOB: 04/12/1976, 46 y.o.   MRN: [id number]  Chief Complaint: Abdominal pain  History of Present Illness Jenna Kirk is a 46 y.o. female with a recent visit to the ED, for right upper quadrant/right lower quadrant abdominal pain with radiation to her back.  Known gallstones.  Resolution of pain prior to discharge from ED.  Elevated total bili of 3.6 with abnormal transaminases and normal alkaline phosphatase.  Returned home on a low-fat diet, but continues to have dark orange-yellow urine.  CBD diameter was 4 mm on ultrasound.  No known fevers or chills.  She denies any additional nausea or vomiting, but has some lingering vague discomfort, anorexia. MRCP today without evidence of choledocholithiasis. LFTs slightly worse today with abnormal alkaline phos in addition.   Past Medical History Past Medical History:  Diagnosis Date   Anxiety    Fibromyalgia    Gestational diabetes    glyburide   Hx of varicella    Scheuermann disease       Past Surgical History:  Procedure Laterality Date   CESAREAN SECTION     CESAREAN SECTION N/A 10/16/2016   Procedure: CESAREAN SECTION;  Surgeon: Jaymes Graff, MD;  Location: [facility];  Service: Obstetrics;  Laterality: N/A;  Odelia Gage, RNFA   MOUTH SURGERY      Allergies  Allergen Reactions   Penicillins Anaphylaxis   Aleve [Naproxen] Itching   Orlistat Diarrhea   Keflex [Cephalexin] Rash   Other Rash    Dodecyl gallate - allergic contact dermatitis    Current Facility-Administered Medications  Medication Dose Route Frequency Provider Last Rate Last Admin   [START ON 12/07/2021] acetaminophen (TYLENOL) tablet 1,000 mg  1,000 mg Oral On Call to OR Campbell Lerner, MD       bupivacaine liposome (EXPAREL) 1.3 % injection 266 mg  20 mL Infiltration Once Campbell Lerner, MD       chlorhexidine (PERIDEX) 0.12 % solution 15 mL  15 mL Mouth/Throat Once Lenard Simmer, MD       Or    Oral care mouth rinse  15 mL Mouth Rinse Once Lenard Simmer, MD       Chlorhexidine Gluconate Cloth 2 % PADS 6 each  6 each Topical Once Campbell Lerner, MD       And   Chlorhexidine Gluconate Cloth 2 % PADS 6 each  6 each Topical Once Campbell Lerner, MD       Melene Muller ON 12/07/2021] ciprofloxacin (CIPRO) IVPB 400 mg  400 mg Intravenous On Call to OR Campbell Lerner, MD       Melene Muller ON 12/07/2021] gabapentin (NEURONTIN) capsule 300 mg  300 mg Oral On Call to OR Campbell Lerner, MD       indocyanine green (IC-GREEN) injection 1.25 mg  1.25 mg Intravenous Once Campbell Lerner, MD       lactated ringers infusion   Intravenous Continuous Lenard Simmer, MD        Family History Family History  Problem Relation Age of Onset   Thyroid disease Mother    Thyroid disease Father    Stroke Maternal Grandmother    Aneurysm Maternal Grandfather       Social History Social History   Tobacco Use   Smoking status: Never   Smokeless tobacco: Never  Vaping Use   Vaping Use: Never used  Substance Use Topics   Alcohol use: No   Drug use: No        Physical  Exam Last menstrual period 11/10/2021, unknown if currently breastfeeding.   CONSTITUTIONAL: Well developed, and nourished, appropriately responsive and aware without distress.   EYES: Sclera non-icteric.   EARS, NOSE, MOUTH AND THROAT:  The oropharynx is clear. Oral mucosa is pink and moist.    Hearing is intact to voice.  NECK: Trachea is midline, and there is no jugular venous distension.  LYMPH NODES:  Lymph nodes in the neck are not enlarged. RESPIRATORY:  Lungs are clear, and breath sounds are equal bilaterally. Normal respiratory effort without pathologic use of accessory muscles. CARDIOVASCULAR: Heart is regular in rate and rhythm. GI: The abdomen is mild right upper quadrant tenderness, otherwise soft, nontender, and nondistended. There were no palpable masses. I did not appreciate hepatosplenomegaly. There were normal bowel  sounds. MUSCULOSKELETAL:  Symmetrical muscle tone appreciated in all four extremities.    SKIN: Skin turgor is normal. No pathologic skin lesions appreciated.  NEUROLOGIC:  Motor and sensation appear grossly normal.  Cranial nerves are grossly without defect. PSYCH:  Alert and oriented to person, place and time. Affect is appropriate for situation.  Data Reviewed I have personally reviewed what is currently available of the patient's imaging, recent labs and medical records.   Labs:     Latest Ref Rng & Units 12/06/2021   11:43 AM 12/04/2021    7:38 PM 10/17/2016    5:01 AM  CBC  WBC 4.0 - 10.5 K/uL 5.2  11.2  12.5   Hemoglobin 12.0 - 15.0 g/dL 34.2  87.6  9.5   Hematocrit 36.0 - 46.0 % 47.5  46.4  27.7   Platelets 150 - 400 K/uL 236  247  186       Latest Ref Rng & Units 12/06/2021   11:43 AM 12/04/2021    7:38 PM 05/14/2012   10:50 PM  CMP  Glucose 70 - 99 mg/dL 811  572  620   BUN 6 - 20 mg/dL 12  17  18    Creatinine 0.44 - 1.00 mg/dL  3.55  9.74   Sodium 135 - 145 mmol/L 140  141  140   Potassium 3.5 - 5.1 mmol/L 4.2  4.0  3.8   Chloride 98 - 111 mmol/L 112  105  109   CO2 22 - 32 mmol/L 23  24  23    Calcium 8.9 - 10.3 mg/dL 9.3  9.4  8.3   Total Protein 6.5 - 8.1 g/dL 7.5  7.6  7.8   Total Bilirubin 0.3 - 1.2 mg/dL 3.5  3.6  0.7   Alkaline Phos 38 - 126 U/L 165  94  84   AST 15 - 41 U/L 181  298  21   ALT 0 - 44 U/L 346  313  28     Imaging:  Within last 24 hrs: MR ABDOMEN MRCP W WO CONTAST  Result Date: 12/06/2021 CLINICAL DATA:  Right upper quadrant abdominal pain with nausea and vomiting for 2 days. Abnormal ultrasound. EXAM: MRI ABDOMEN WITHOUT AND WITH CONTRAST (INCLUDING MRCP) TECHNIQUE: Multiplanar multisequence MR imaging of the abdomen was performed both before and after the administration of intravenous contrast. Heavily T2-weighted images of the biliary and pancreatic ducts were obtained, and three-dimensional MRCP images were rendered by post processing.  CONTRAST:  38mL GADAVIST GADOBUTROL 1 MMOL/ML IV SOLN COMPARISON:  Abdominal ultrasound 12/05/2021. Abdominopelvic CT 12/05/2021 and 05/15/2012. FINDINGS: Lower chest:  The visualized lower chest appears unremarkable. Hepatobiliary: Normal hepatic morphology without evidence of steatosis. 5 mm T2  hyperintense lesion posteriorly in the right hepatic lobe (segment 7) demonstrates heterogeneous early enhancement and is isointense on the subsequent images, consistent with a flash filling hemangioma. No suspicious liver lesions. At least 2 gallstones are noted. There is mild gallbladder wall thickening and pericholecystic fluid. No intra or extrahepatic biliary dilatation. No evidence of choledocholithiasis. Pancreas: Unremarkable. No pancreatic ductal dilatation or surrounding inflammatory changes. Spleen: Normal in size without focal abnormality. Adrenals/Urinary Tract: Both adrenal glands appear normal. 11 mm lesion in the upper pole of the right kidney demonstrates intrinsic T1 shortening, but no enhancement following contrast, consistent with a small Bosniak 2 cyst; no follow-up imaging recommended. Both kidneys otherwise appear unremarkable, without hydronephrosis. Stomach/Bowel: The stomach appears unremarkable for its degree of distension. No evidence of bowel wall thickening, distention or surrounding inflammatory change. Vascular/Lymphatic: There are no enlarged abdominal lymph nodes. No significant vascular findings. Other: No evidence of abdominal wall hernia or ascites. Musculoskeletal: No acute or significant osseous findings. IMPRESSION: 1. Cholelithiasis with mild gallbladder wall thickening and pericholecystic fluid as seen on recent prior studies. Possible early cholecystitis. 2. No evidence of biliary dilatation or choledocholithiasis. 3. No other significant findings. Electronically Signed   By: Carey Bullocks M.D.   On: 12/06/2021 12:48    Assessment    Subacute on chronic calculus  cholecystitis Patient Active Problem List   Diagnosis Date Noted   CCC (chronic calculous cholecystitis) 12/05/2021   Drug allergy, antibiotic 06/30/2017   S/P cesarean section 10/16/2016   Anxiety 10/13/2016   Fibromyalgia 10/13/2016   Gestational diabetes--on Glyburide 10/13/2016   Scheuermann disease 10/13/2016   Maternal varicella, non-immune 10/13/2016   History of cesarean section--2012, FTP 10/13/2016   History of poor fetal growth--lag in [facility] 10/13/2016   Allergy to cephalosporin 10/13/2016    Plan    Robotic cholecystectomy with ICG imaging.  This was discussed thoroughly.  Optimal plan is for robotic cholecystectomy utilizing ICG imaging. Risks and benefits have been discussed with the patient which include but are not limited to anesthesia, bleeding, infection, biliary ductal injury, resulting in leak or stenosis, other associated unanticipated injuries affiliated with laparoscopic surgery.   Reviewed that removing the gallbladder will only address the symptoms related to the gallbladder itself.  I believe there is the desire to proceed, accepting the risks with understanding.  Questions elicited and answered to satisfaction.    No guarantees ever expressed or implied.   Face-to-face time spent with the patient and accompanying care providers(if present) was 60 minutes, with more than 50% of the time spent counseling, educating, and coordinating care of the patient.    These notes generated with voice recognition software. I apologize for typographical errors.  Campbell Lerner M.D., FACS 12/06/2021, 1:06 PM

## 2021-12-06 NOTE — Progress Notes (Signed)
Had telephone discussion with Jenna Kirk both last night and this morning.  Of note she is not feeling well as though everything has resolved.  She continues to have some dark yellow/orange urine and has remained well-hydrated and yet it persists.  She denies fevers and chills, and has tolerated liquids until she became n.p.o. at 7 this morning.  She is pending MRCP for later this morning to evaluate for common duct stones.    Magnetic resonance cholangiopancreatography Very effective in the diagnosis of choledocholithiasis (sensitivity of 85%-92% and specificity of 93%-97%) 8 Poor accuracy if stones are small (smaller than 4 mm) and sit very close to the ampulla of Vater 6  We will repeat CMP and CBC this morning.  And she remains n.p.o. for possible cholecystectomy later this afternoon.  She is well aware that any common duct obstruction and presence of choledocholithiasis, would warrant transfer to have an ERCP completed.  And ideally this would be done before her cholecystectomy.

## 2021-12-06 NOTE — Op Note (Signed)
Robotic cholecystectomy with Indocyamine Green Ductal Imaging.   Pre-operative Diagnosis: Subacute on chronic calculus cholecystitis  Post-operative Diagnosis:  Same.  Procedure: Robotic assisted laparoscopic cholecystectomy with Indocyamine Green Ductal Imaging.   Surgeon: Campbell Lerner, M.D., FACS  Anesthesia: General. with endotracheal tube  Findings: Edematous gallbladder with small stone and cystic duct.  Estimated Blood Loss: 15 mL         Drains: None         Specimens: Gallbladder           Complications: none  Procedure Details  The patient was seen again in the Holding Room.  1.25 mg dose of ICG was administered intravenously.   The benefits, complications, treatment options, risks and expected outcomes were again reviewed with the patient. The likelihood of improving the patient's symptoms with return to their baseline status is good.  The patient and/or family concurred with the proposed plan, giving informed consent, again alternatives reviewed.  The patient was taken to Operating Room, identified, and the procedure verified as robotic assisted laparoscopic cholecystectomy.  Prior to the induction of general anesthesia, antibiotic prophylaxis was administered. VTE prophylaxis was in place. General endotracheal anesthesia was then administered and tolerated well. The patient was positioned in the supine position.  After the induction, the abdomen was prepped with Chloraprep and draped in the sterile fashion.  A Time Out was held and the above information confirmed.  Right para-umbilical local infiltration with quarter percent Marcaine with epinephrine is utilized.  Made a 12 mm incision on the right periumbilical site, I advanced an optical 80mm port under direct visualization into the peritoneal cavity.  Once the peritoneum was penetrated, insufflation was initiated.  The trocar was then advanced into the abdominal cavity under direct visualization. Pneumoperitoneum was  then continued utilizing CO2 at 15 mmHg or less and tolerated well without any adverse changes in the patient's vital signs.  Two 8.5-mm ports were placed in the left lower quadrant and laterally, and one to the right lower quadrant, all under direct vision. All skin incisions  were infiltrated with a local anesthetic agent before making the incision and placing the trocars.  The patient was positioned  in reverse Trendelenburg, tilted the patient's left side down.  Da Vinci XI robot was then positioned on to the patient's left side, and docked.  The gallbladder was identified, the fundus grasped via the arm 4 Prograsp and retracted cephalad. Adhesions were lysed with scissors and cautery.  The infundibulum was identified grasped and retracted laterally, exposing the peritoneum overlying the triangle of Calot. This was then opened and dissected using cautery & scissors. An extended critical view of the cystic duct and cystic artery was obtained, aided by the ICG via FireFly which improved localization of the ductal anatomy.    The cystic duct was clearly identified and dissected to isolation.   Artery well isolated and cauterized with bipolar and divided with monopolar scissors the artery was outside the triangle of Calot.  And the first structure we encountered. The cystic duct was triple clipped and divided with scissors, as close to the gallbladder neck as feasible, thus leaving two on the remaining stump.     The gallbladder was taken from the gallbladder fossa in a retrograde fashion with the electrocautery. The gallbladder was removed and placed in an Endocatch bag.  The liver bed is inspected. Hemostasis was confirmed.  The robot was undocked and moved away from the operative field. No irrigation was utilized. The gallbladder and Endocatch  sac were then removed through the infraumbilical port site.   Inspection of the right upper quadrant was performed. No bleeding, bile duct injury or leak, or  bowel injury was noted. The infra-umbilical port site fascia was closed with interrumpted 0 Vicryl suture using PMI/cone under direct visualization. Pneumoperitoneum was released and ports removed.  4-0 subcuticular Monocryl was used to close the skin. Dermabond was  applied.  The patient was then extubated and brought to the recovery room in stable condition. Sponge, lap, and needle counts were correct at closure and at the conclusion of the case.               Campbell Lerner, M.D., [facility] 12/06/2021 3:20 PM

## 2021-12-06 NOTE — Transfer of Care (Signed)
Immediate Anesthesia Transfer of Care Note  Patient: Jenna Kirk  Procedure(s) Performed: XI ROBOTIC ASSISTED LAPAROSCOPIC CHOLECYSTECTOMY INDOCYANINE GREEN FLUORESCENCE IMAGING (ICG)  Patient Location: PACU  Anesthesia Type:General  Level of Consciousness: drowsy and patient cooperative  Airway & Oxygen Therapy: Patient Spontanous Breathing and Patient connected to face mask oxygen  Post-op Assessment: Report given to RN and Post -op Vital signs reviewed and stable  Post vital signs: Reviewed and stable  Last Vitals:  Vitals Value Taken Time  BP 137/82 12/06/21 1521  Temp 36.7 C 12/06/21 1521  Pulse 75 12/06/21 1527  Resp 12 12/06/21 1527  SpO2 100 % 12/06/21 1527  Vitals shown include unvalidated device data.  Last Pain:  Vitals:   12/06/21 1343  PainSc: 2          Complications: No notable events documented.

## 2021-12-07 NOTE — Anesthesia Preprocedure Evaluation (Signed)
Anesthesia Evaluation  Patient identified by MRN, date of birth, ID band Patient awake    Reviewed: Allergy & Precautions, H&P , NPO status , Patient's Chart, lab work & pertinent test results, reviewed documented beta blocker date and time   Airway Mallampati: II  TM Distance: >3 FB Neck ROM: full    Dental  (+) Teeth Intact   Pulmonary neg pulmonary ROS,    Pulmonary exam normal        Cardiovascular negative cardio ROS Normal cardiovascular exam Rhythm:regular Rate:Normal     Neuro/Psych negative neurological ROS  negative psych ROS   GI/Hepatic negative GI ROS, Neg liver ROS,   Endo/Other  negative endocrine ROSdiabetes  Renal/GU negative Renal ROS  negative genitourinary   Musculoskeletal   Abdominal   Peds  Hematology negative hematology ROS (+)   Anesthesia Other Findings Past Medical History: No date: Anxiety No date: Fibromyalgia No date: Gestational diabetes     Comment:  glyburide No date: Hx of varicella No date: Scheuermann disease Past Surgical History: No date: CESAREAN SECTION 10/16/2016: CESAREAN SECTION; N/A     Comment:  Procedure: CESAREAN SECTION;  Surgeon: Jaymes Graff,               MD;  Location: [facility];  Service: Obstetrics;               Laterality: N/A;  Heather K, RNFA No date: MOUTH SURGERY   Reproductive/Obstetrics negative OB ROS                             Anesthesia Physical Anesthesia Plan  ASA: 2  Anesthesia Plan: General ETT   Post-op Pain Management:    Induction:   PONV Risk Score and Plan:   Airway Management Planned:   Additional Equipment:   Intra-op Plan:   Post-operative Plan:   Informed Consent: I have reviewed the patients History and Physical, chart, labs and discussed the procedure including the risks, benefits and alternatives for the proposed anesthesia with the patient or authorized representative who  has indicated his/her understanding and acceptance.     Dental Advisory Given  Plan Discussed with: CRNA  Anesthesia Plan Comments:         Anesthesia Quick Evaluation

## 2021-12-09 ENCOUNTER — Other Ambulatory Visit: Payer: Self-pay | Admitting: Surgery

## 2021-12-09 ENCOUNTER — Inpatient Hospital Stay: Admission: RE | Admit: 2021-12-09 | Payer: No Typology Code available for payment source | Source: Ambulatory Visit

## 2021-12-09 ENCOUNTER — Telehealth: Payer: Self-pay | Admitting: Surgery

## 2021-12-09 MED ORDER — OXYCODONE-ACETAMINOPHEN 5-325 MG PO TABS: 1.0000 | ORAL_TABLET | Freq: Four times a day (QID) | ORAL | 0 refills | Status: AC | PRN

## 2021-12-09 NOTE — Progress Notes (Signed)
Post-op refill.

## 2021-12-09 NOTE — Telephone Encounter (Signed)
Patient calls, she had robotic cholecystectomy done on 12/06/21 with Dr Claudine Mouton.  Patient is still with pain, but feels like she is headed in the right direction with recovery.  She is wanting to know if can have a refill on the Oxycodone, she took her last one. This seems to help.  I did verify her pharmacy and correct in chart.  If can't have a refill on the Oxy then what else she can do to help with the pain.   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  Or how long she needs to stay out of work?   Please call her. Thank you.

## 2021-12-10 LAB — SURGICAL PATHOLOGY

## 2021-12-11 ENCOUNTER — Encounter: Admission: RE | Payer: Self-pay | Source: Home / Self Care

## 2021-12-11 ENCOUNTER — Ambulatory Visit
Admission: RE | Admit: 2021-12-11 | Payer: No Typology Code available for payment source | Source: Home / Self Care | Admitting: Surgery

## 2021-12-11 SURGERY — CHOLECYSTECTOMY, ROBOT-ASSISTED, LAPAROSCOPIC
Anesthesia: General

## 2021-12-17 ENCOUNTER — Encounter: Payer: Self-pay | Admitting: Physician Assistant

## 2021-12-17 ENCOUNTER — Encounter: Payer: No Typology Code available for payment source | Admitting: Physician Assistant

## 2021-12-17 ENCOUNTER — Ambulatory Visit (INDEPENDENT_AMBULATORY_CARE_PROVIDER_SITE_OTHER): Payer: No Typology Code available for payment source | Admitting: Physician Assistant

## 2021-12-17 VITALS — BP 119/77 | HR 86 | Temp 98.5°F | Wt 163.8 lb

## 2021-12-17 DIAGNOSIS — K8010 Calculus of gallbladder with chronic cholecystitis without obstruction: Secondary | ICD-10-CM

## 2021-12-17 DIAGNOSIS — K8012 Calculus of gallbladder with acute and chronic cholecystitis without obstruction: Secondary | ICD-10-CM

## 2021-12-17 DIAGNOSIS — Z09 Encounter for follow-up examination after completed treatment for conditions other than malignant neoplasm: Secondary | ICD-10-CM

## 2021-12-17 NOTE — Progress Notes (Signed)
[facility] POST-OP OFFICE VISIT  12/17/2021  HPI: Jenna Kirk is a 46 y.o. female 11 days s/p robotic assisted laparoscopic cholecystectomy for subacute on chronic cholecystitis with Dr Claudine Mouton   She is overall doing very well Some abdominal soreness with twisting; otherwise no pain No fever, chills, nausea, emesis, or diarrhea She is tolerating PO; limiting fatty foods No issues with incisions aside from bruising  Vital signs: BP 119/77   Pulse 86   Temp 98.5 F (36.9 C) (Oral)   Wt 163 lb 12.8 oz (74.3 kg)   LMP 11/10/2021 (Exact Date)   SpO2 98%   BMI 25.28 kg/m    Physical Exam: Constitutional: Well appearing female, NAD Abdomen: Soft, non-tender, non-distended, no rebound/guarding Skin: Laparoscopic incisions are healing well, no erythema or drainage. Ecchymosis at umbilical incision  Assessment/Plan: This is a 46 y.o. female 11 days s/p robotic assisted laparoscopic cholecystectomy for subacute on chronic cholecystitis with Dr Claudine Mouton    - Pain control prn  - Reviewed wound care recommendation  - Reviewed lifting restrictions; 4 weeks total  - Reviewed surgical pathology; CCC  - She can follow up on as needed basis; She understands to call with questions/concerns  -- Lynden Oxford, PA-C [facility] 12/17/2021, 1:49 PM M-F: 7am - 4pm

## 2021-12-17 NOTE — Patient Instructions (Addendum)
If you have any concerns or questions, please feel free to call our office. Follow up as needed.    GENERAL POST-OPERATIVE PATIENT INSTRUCTIONS   WOUND CARE INSTRUCTIONS:  Keep a dry clean dressing on the wound if there is drainage. The initial bandage may be removed after 24 hours.  Once the wound has quit draining you may leave it open to air.  If clothing rubs against the wound or causes irritation and the wound is not draining you may cover it with a dry dressing during the daytime.  Try to keep the wound dry and avoid ointments on the wound unless directed to do so.  If the wound becomes bright red and painful or starts to drain infected material that is not clear, please contact your physician immediately.  If the wound is mildly pink and has a thick firm ridge underneath it, this is normal, and is referred to as a healing ridge.  This will resolve over the next 4-6 weeks.  BATHING: You may shower if you have been informed of this by your surgeon. However, Please do not submerge in a tub, hot tub, or pool until incisions are completely sealed or have been told by your surgeon that you may do so.  DIET:  You may eat any foods that you can tolerate.  It is a good idea to eat a high fiber diet and take in plenty of fluids to prevent constipation.  If you do become constipated you may want to take a mild laxative or take ducolax tablets on a daily basis until your bowel habits are regular.  Constipation can be very uncomfortable, along with straining, after recent surgery.  ACTIVITY:  You are encouraged to cough and deep breath or use your incentive spirometer if you were given one, every 15-30 minutes when awake.  This will help prevent respiratory complications and low grade fevers post-operatively if you had a general anesthetic.  You may want to hug a pillow when coughing and sneezing to add additional support to the surgical area, if you had abdominal or chest surgery, which will decrease pain  during these times.  You are encouraged to walk and engage in light activity for the next two weeks.  You should not lift more than 20 pounds for 4 weeks total after surgery as it could put you at increased risk for complications.  Twenty pounds is roughly equivalent to a plastic bag of groceries. At that time- Listen to your body when lifting, if you have pain when lifting, stop and then try again in a few days. Soreness after doing exercises or activities of daily living is normal as you get back in to your normal routine.  MEDICATIONS:  Try to take narcotic medications and anti-inflammatory medications, such as tylenol, ibuprofen, naprosyn, etc., with food.  This will minimize stomach upset from the medication.  Should you develop nausea and vomiting from the pain medication, or develop a rash, please discontinue the medication and contact your physician.  You should not drive, make important decisions, or operate machinery when taking narcotic pain medication.  SUNBLOCK Use sun block to incision area over the next year if this area will be exposed to sun. This helps decrease scarring and will allow you avoid a permanent darkened area over your incision.  QUESTIONS:  Please feel free to call our office if you have any questions, and we will be glad to assist you. [phone]

## 2022-04-30 IMAGING — MG MM DIGITAL DIAGNOSTIC UNILAT*R* W/ TOMO W/ CAD
8 series · 9 of 24 positions shown · non-contrast
Comparison: Previous exam(s).

CLINICAL DATA: Patient recalled from screening for right breast
asymmetry.

EXAM:
DIGITAL DIAGNOSTIC RIGHT MAMMOGRAM WITH CAD AND TOMO
ULTRASOUND RIGHT BREAST

[R CC synth-2D (1 of 2)]
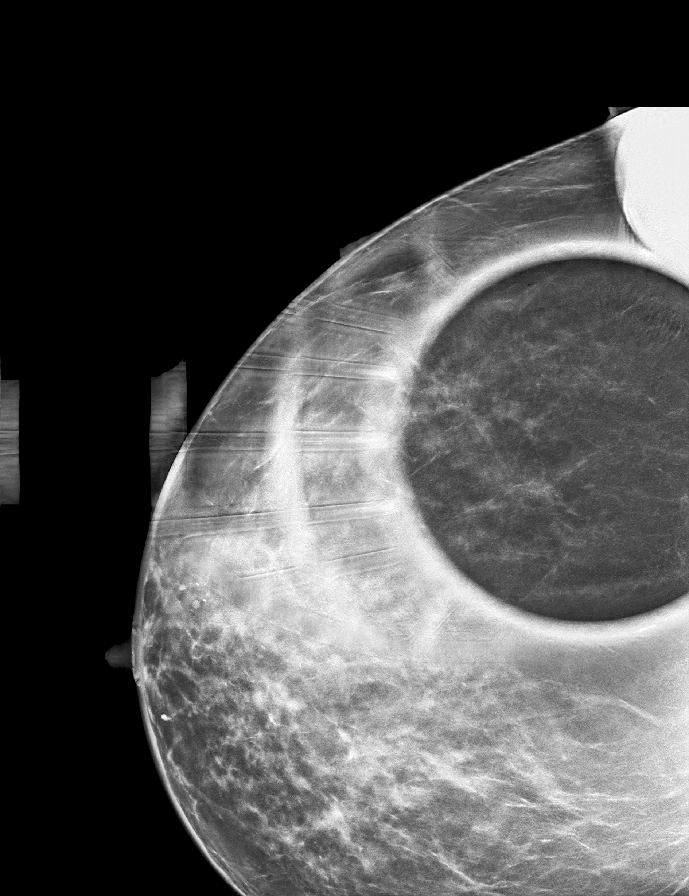

[R CC synth-2D (2 of 2)]
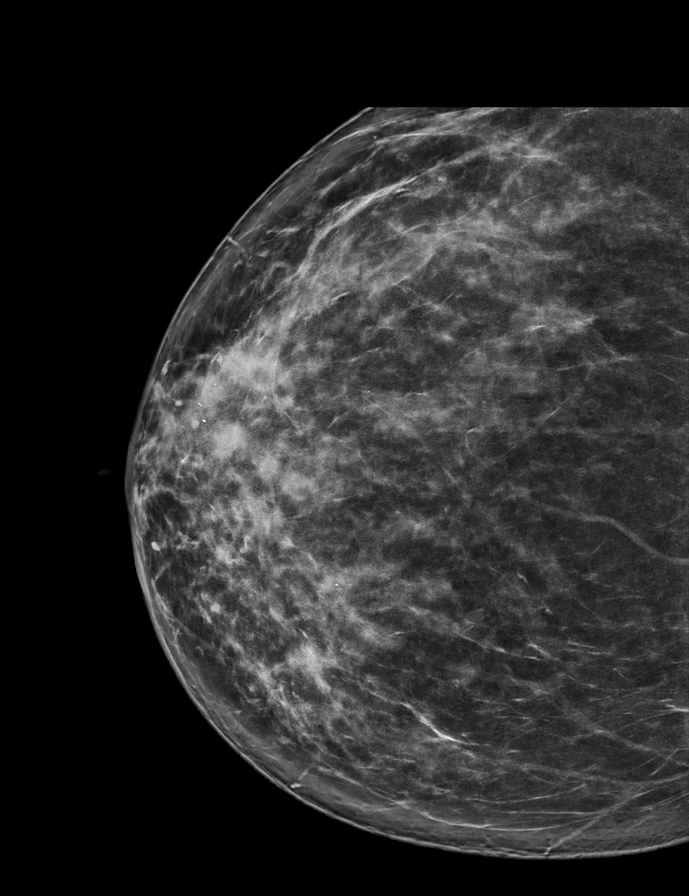

[R ML synth-2D]
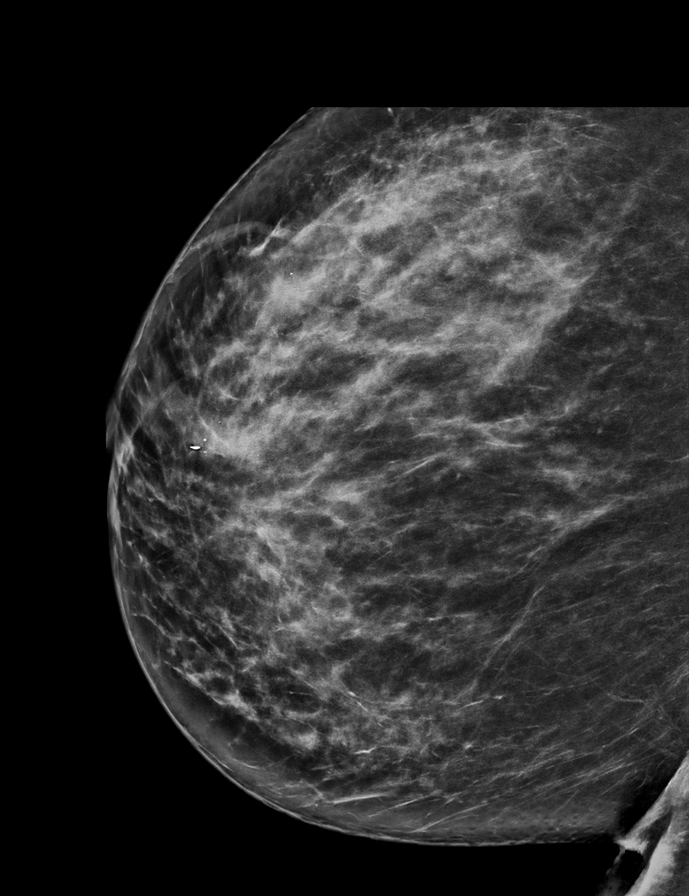

[R XCCL synth-2D]
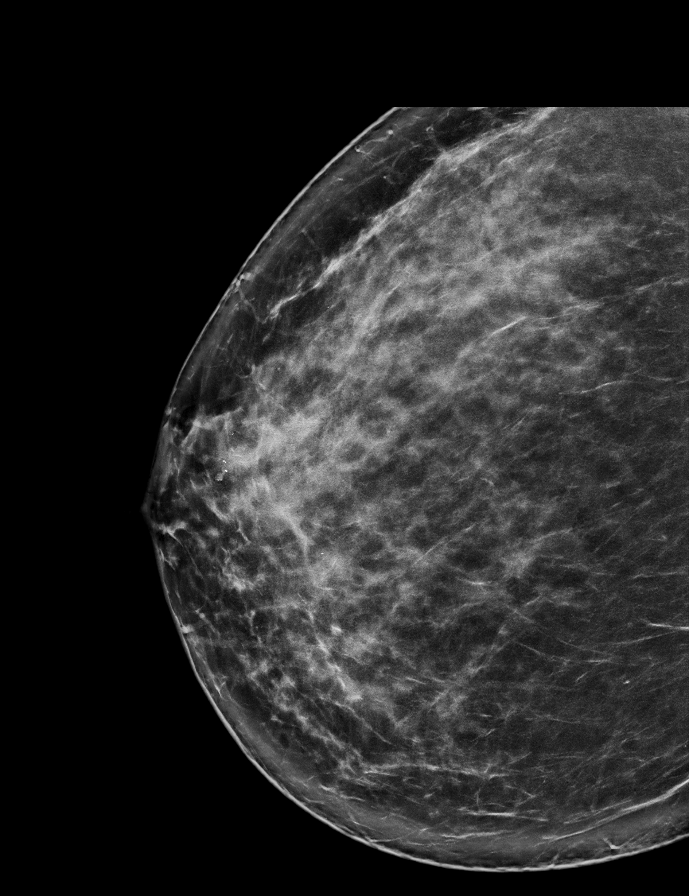

[R CC tomo · 2 of 82 frames shown (1 of 2)]
[frame 27/82]
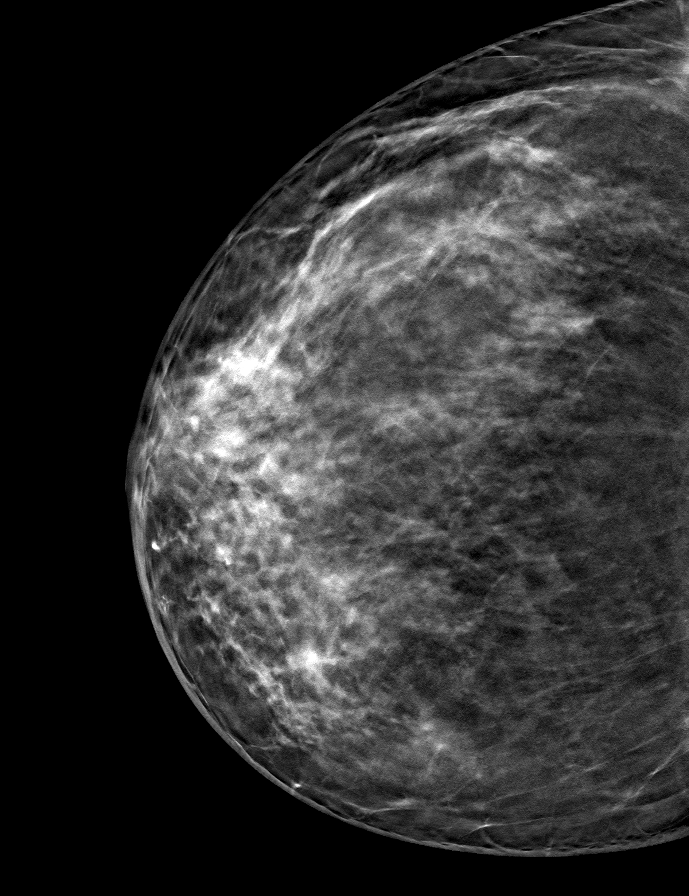
[frame 41/82]
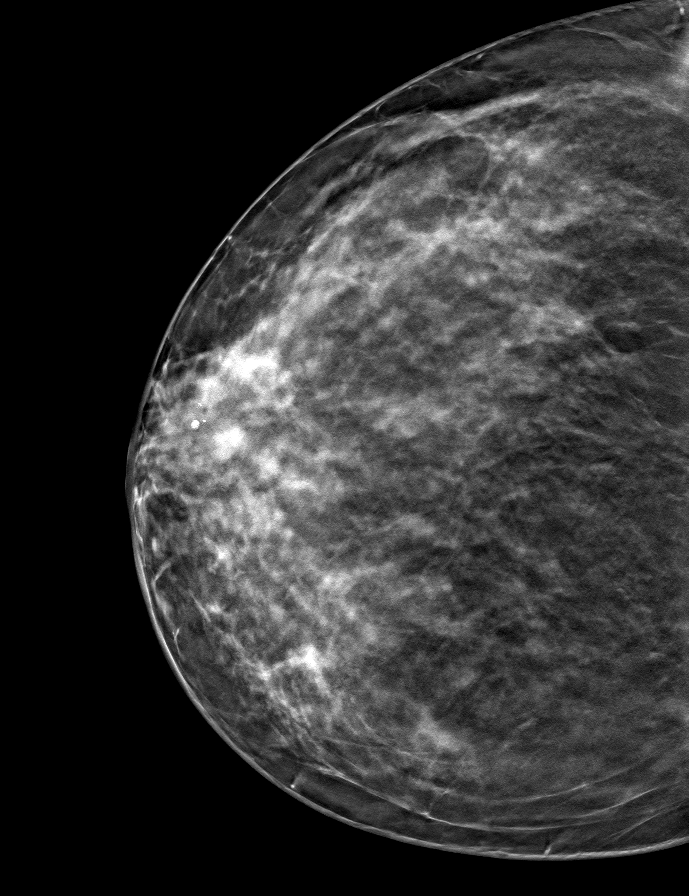

[R CC tomo (2 of 2) · tomo slice 39/78.0]
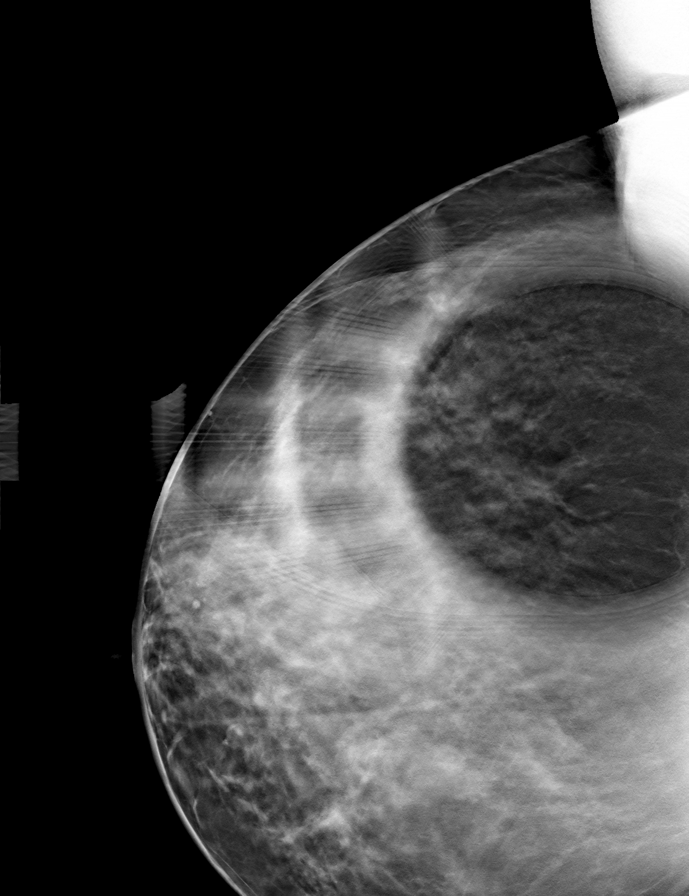

[R XCCL tomo · tomo slice 41/82.0]
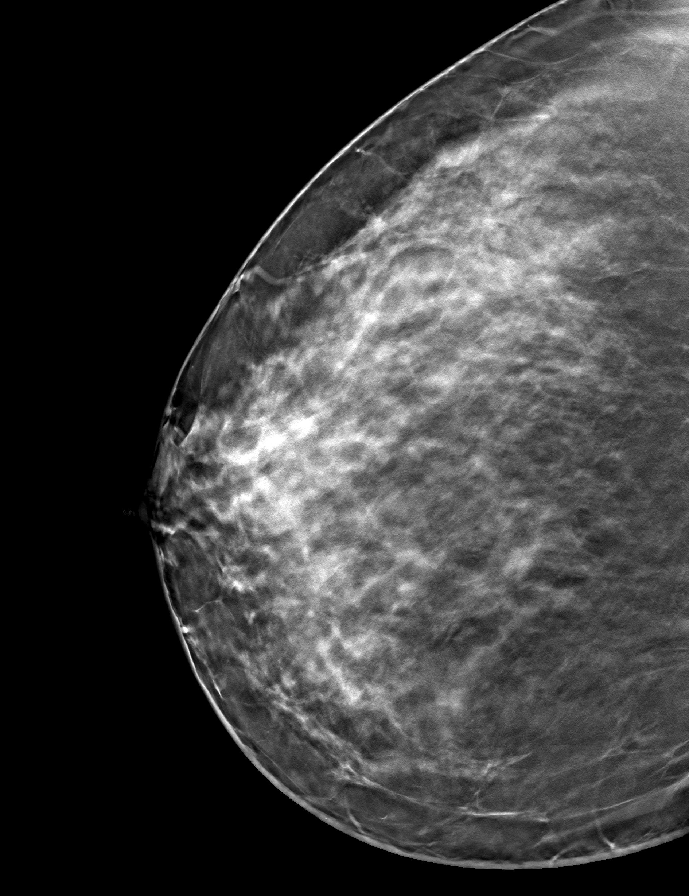

[R ML tomo · tomo slice 39/76.0]
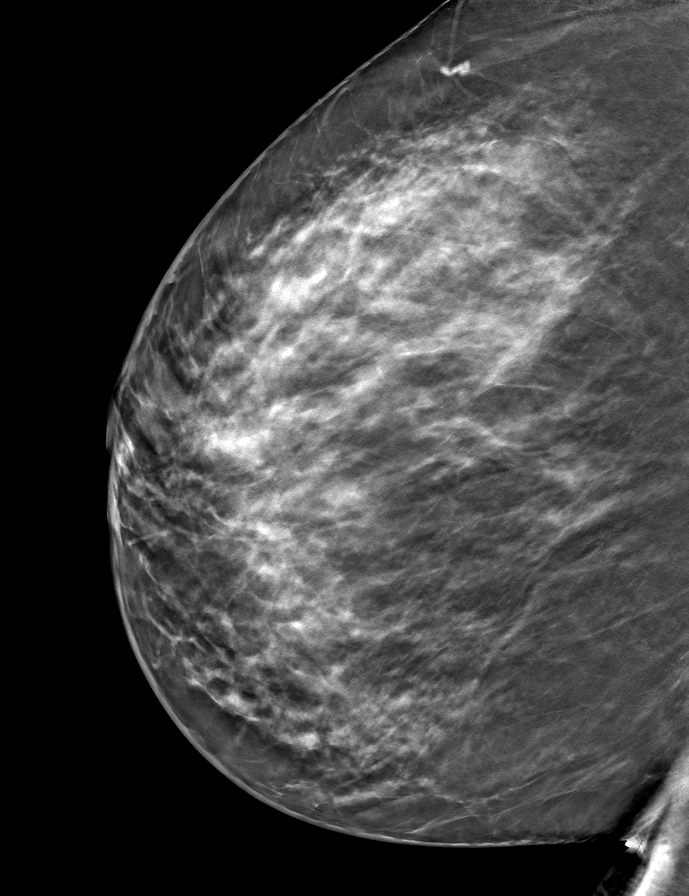

[9 of 24 positions shown; findings below may reference images not displayed]

ACR Breast Density Category c: The breast tissue is heterogeneously
dense, which may obscure small masses.
FINDINGS: Questioned asymmetry within the outer aspect of the right breast
partially effaced with additional imaging suggestive of dense
fibroglandular tissue.

Mammographic images were processed with CAD.

Targeted ultrasound is performed, showing normal tissue without
suspicious mass within the outer aspect of the right breast.
IMPRESSION: No mammographic evidence for malignancy.

RECOMMENDATION:
Screening mammogram in one year.(Code:[id number])

I have discussed the findings and recommendations with the patient.
If applicable, a reminder letter will be sent to the patient
regarding the next appointment.

BI-RADS CATEGORY  2: Benign.

## 2022-08-25 DIAGNOSIS — M797 Fibromyalgia: Secondary | ICD-10-CM | POA: Diagnosis not present

## 2022-08-25 DIAGNOSIS — F329 Major depressive disorder, single episode, unspecified: Secondary | ICD-10-CM | POA: Diagnosis not present

## 2022-08-25 DIAGNOSIS — G43909 Migraine, unspecified, not intractable, without status migrainosus: Secondary | ICD-10-CM | POA: Diagnosis not present

## 2022-08-25 DIAGNOSIS — M1990 Unspecified osteoarthritis, unspecified site: Secondary | ICD-10-CM | POA: Diagnosis not present

## 2022-09-10 DIAGNOSIS — Z79899 Other long term (current) drug therapy: Secondary | ICD-10-CM | POA: Diagnosis not present

## 2022-09-10 DIAGNOSIS — R768 Other specified abnormal immunological findings in serum: Secondary | ICD-10-CM | POA: Diagnosis not present

## 2022-09-10 DIAGNOSIS — M1990 Unspecified osteoarthritis, unspecified site: Secondary | ICD-10-CM | POA: Diagnosis not present

## 2022-09-10 DIAGNOSIS — E669 Obesity, unspecified: Secondary | ICD-10-CM | POA: Diagnosis not present

## 2022-09-10 DIAGNOSIS — M797 Fibromyalgia: Secondary | ICD-10-CM | POA: Diagnosis not present

## 2022-09-25 DIAGNOSIS — G43909 Migraine, unspecified, not intractable, without status migrainosus: Secondary | ICD-10-CM | POA: Diagnosis not present

## 2022-09-25 DIAGNOSIS — F329 Major depressive disorder, single episode, unspecified: Secondary | ICD-10-CM | POA: Diagnosis not present

## 2022-11-25 ENCOUNTER — Ambulatory Visit (LOCAL_COMMUNITY_HEALTH_CENTER): Payer: Self-pay

## 2022-11-25 DIAGNOSIS — Z0184 Encounter for antibody response examination: Secondary | ICD-10-CM

## 2022-11-25 DIAGNOSIS — Z111 Encounter for screening for respiratory tuberculosis: Secondary | ICD-10-CM

## 2022-11-25 NOTE — Progress Notes (Signed)
Pt seen in nurse clinic requested TB ppd skin test, MMR titer and Hep B titer needed for school. No vaccine record provided other than [facility]. Pt paid PPD skin test and titers. Signed ROI, walked pt to lab for blood work, came back to clinic, administered TB ppd skin test. Pt received PPDR appt  reminder to come back this Friday. M.Enya Bureau, LPN.

## 2022-11-28 ENCOUNTER — Ambulatory Visit (LOCAL_COMMUNITY_HEALTH_CENTER): Payer: BC Managed Care – PPO

## 2022-11-28 ENCOUNTER — Ambulatory Visit: Payer: BC Managed Care – PPO

## 2022-11-28 DIAGNOSIS — Z23 Encounter for immunization: Secondary | ICD-10-CM | POA: Diagnosis not present

## 2022-11-28 DIAGNOSIS — Z111 Encounter for screening for respiratory tuberculosis: Secondary | ICD-10-CM

## 2022-11-28 DIAGNOSIS — Z719 Counseling, unspecified: Secondary | ICD-10-CM

## 2022-11-28 NOTE — Progress Notes (Signed)
Pt seen in clinic for Hep B vaccine based on low immunity per Hep B titer results done on 11/25/22 @ [facility]. Given VIS, agreed for Hep B (Recombivax adult) vaccine, administered and tolerated well. Given [facility] copies, informed of next vaccine visit. M.Tonyetta Berko, LPN.

## 2022-12-11 NOTE — Addendum Note (Signed)
Addended by: Heywood Bene on: 12/11/2022 03:56 PM   Modules accepted: Orders

## 2022-12-26 ENCOUNTER — Ambulatory Visit: Payer: BC Managed Care – PPO

## 2022-12-26 DIAGNOSIS — Z03818 Encounter for observation for suspected exposure to other biological agents ruled out: Secondary | ICD-10-CM | POA: Diagnosis not present

## 2022-12-26 DIAGNOSIS — J029 Acute pharyngitis, unspecified: Secondary | ICD-10-CM | POA: Diagnosis not present

## 2022-12-26 DIAGNOSIS — J209 Acute bronchitis, unspecified: Secondary | ICD-10-CM | POA: Diagnosis not present

## 2022-12-26 DIAGNOSIS — J019 Acute sinusitis, unspecified: Secondary | ICD-10-CM | POA: Diagnosis not present

## 2022-12-26 DIAGNOSIS — U071 COVID-19: Secondary | ICD-10-CM | POA: Diagnosis not present

## 2022-12-29 ENCOUNTER — Ambulatory Visit: Payer: BC Managed Care – PPO

## 2023-01-02 ENCOUNTER — Ambulatory Visit: Payer: BC Managed Care – PPO

## 2023-01-02 DIAGNOSIS — Z23 Encounter for immunization: Secondary | ICD-10-CM | POA: Diagnosis not present

## 2023-01-02 DIAGNOSIS — Z719 Counseling, unspecified: Secondary | ICD-10-CM

## 2023-01-02 NOTE — Progress Notes (Signed)
Patient seen in nurse clinic for Hep B 2nd dose.  Hep B IM right deltoid.  Tolerated well. VIS declined. [facility] updated and 2 copies provided. Discussed return dates for vaccine.

## 2023-01-05 DIAGNOSIS — Z6827 Body mass index (BMI) 27.0-27.9, adult: Secondary | ICD-10-CM | POA: Diagnosis not present

## 2023-01-05 DIAGNOSIS — Z1152 Encounter for screening for COVID-19: Secondary | ICD-10-CM | POA: Diagnosis not present

## 2023-01-05 DIAGNOSIS — M791 Myalgia, unspecified site: Secondary | ICD-10-CM | POA: Diagnosis not present

## 2023-01-20 DIAGNOSIS — D485 Neoplasm of uncertain behavior of skin: Secondary | ICD-10-CM | POA: Diagnosis not present

## 2023-01-20 DIAGNOSIS — L814 Other melanin hyperpigmentation: Secondary | ICD-10-CM | POA: Diagnosis not present

## 2023-01-20 DIAGNOSIS — L718 Other rosacea: Secondary | ICD-10-CM | POA: Diagnosis not present

## 2023-01-20 DIAGNOSIS — L821 Other seborrheic keratosis: Secondary | ICD-10-CM | POA: Diagnosis not present

## 2023-01-20 DIAGNOSIS — D225 Melanocytic nevi of trunk: Secondary | ICD-10-CM | POA: Diagnosis not present

## 2023-01-20 DIAGNOSIS — L538 Other specified erythematous conditions: Secondary | ICD-10-CM | POA: Diagnosis not present

## 2023-02-25 DIAGNOSIS — Z01419 Encounter for gynecological examination (general) (routine) without abnormal findings: Secondary | ICD-10-CM | POA: Diagnosis not present

## 2023-02-25 DIAGNOSIS — Z1231 Encounter for screening mammogram for malignant neoplasm of breast: Secondary | ICD-10-CM | POA: Diagnosis not present

## 2023-02-25 DIAGNOSIS — Z3040 Encounter for surveillance of contraceptives, unspecified: Secondary | ICD-10-CM | POA: Diagnosis not present

## 2023-02-25 DIAGNOSIS — Z124 Encounter for screening for malignant neoplasm of cervix: Secondary | ICD-10-CM | POA: Diagnosis not present

## 2023-02-25 DIAGNOSIS — Z6828 Body mass index (BMI) 28.0-28.9, adult: Secondary | ICD-10-CM | POA: Diagnosis not present

## 2023-02-25 DIAGNOSIS — Z01411 Encounter for gynecological examination (general) (routine) with abnormal findings: Secondary | ICD-10-CM | POA: Diagnosis not present

## 2023-03-30 DIAGNOSIS — M1990 Unspecified osteoarthritis, unspecified site: Secondary | ICD-10-CM | POA: Diagnosis not present

## 2023-03-30 DIAGNOSIS — R768 Other specified abnormal immunological findings in serum: Secondary | ICD-10-CM | POA: Diagnosis not present

## 2023-03-30 DIAGNOSIS — M549 Dorsalgia, unspecified: Secondary | ICD-10-CM | POA: Diagnosis not present

## 2023-03-30 DIAGNOSIS — M797 Fibromyalgia: Secondary | ICD-10-CM | POA: Diagnosis not present

## 2023-04-01 DIAGNOSIS — H5213 Myopia, bilateral: Secondary | ICD-10-CM | POA: Diagnosis not present

## 2023-04-01 DIAGNOSIS — Z79899 Other long term (current) drug therapy: Secondary | ICD-10-CM | POA: Diagnosis not present

## 2023-04-02 DIAGNOSIS — N6489 Other specified disorders of breast: Secondary | ICD-10-CM | POA: Diagnosis not present

## 2023-04-02 DIAGNOSIS — R9230 Dense breasts, unspecified: Secondary | ICD-10-CM | POA: Diagnosis not present

## 2023-04-02 DIAGNOSIS — R928 Other abnormal and inconclusive findings on diagnostic imaging of breast: Secondary | ICD-10-CM | POA: Diagnosis not present

## 2023-04-16 DIAGNOSIS — Z8632 Personal history of gestational diabetes: Secondary | ICD-10-CM | POA: Diagnosis not present

## 2023-04-16 DIAGNOSIS — Z13220 Encounter for screening for lipoid disorders: Secondary | ICD-10-CM | POA: Diagnosis not present

## 2023-04-16 DIAGNOSIS — D582 Other hemoglobinopathies: Secondary | ICD-10-CM | POA: Diagnosis not present

## 2023-04-16 DIAGNOSIS — Z8639 Personal history of other endocrine, nutritional and metabolic disease: Secondary | ICD-10-CM | POA: Diagnosis not present

## 2023-04-16 DIAGNOSIS — Z862 Personal history of diseases of the blood and blood-forming organs and certain disorders involving the immune mechanism: Secondary | ICD-10-CM | POA: Diagnosis not present

## 2023-04-21 DIAGNOSIS — Z862 Personal history of diseases of the blood and blood-forming organs and certain disorders involving the immune mechanism: Secondary | ICD-10-CM | POA: Diagnosis not present

## 2023-04-21 DIAGNOSIS — Z8632 Personal history of gestational diabetes: Secondary | ICD-10-CM | POA: Diagnosis not present

## 2023-04-21 DIAGNOSIS — Z0000 Encounter for general adult medical examination without abnormal findings: Secondary | ICD-10-CM | POA: Diagnosis not present

## 2023-04-21 DIAGNOSIS — Z8639 Personal history of other endocrine, nutritional and metabolic disease: Secondary | ICD-10-CM | POA: Diagnosis not present

## 2023-04-21 DIAGNOSIS — M797 Fibromyalgia: Secondary | ICD-10-CM | POA: Diagnosis not present

## 2023-04-29 DIAGNOSIS — J989 Respiratory disorder, unspecified: Secondary | ICD-10-CM | POA: Diagnosis not present

## 2023-04-29 DIAGNOSIS — R059 Cough, unspecified: Secondary | ICD-10-CM | POA: Diagnosis not present

## 2023-04-29 DIAGNOSIS — R52 Pain, unspecified: Secondary | ICD-10-CM | POA: Diagnosis not present

## 2023-06-29 DIAGNOSIS — R768 Other specified abnormal immunological findings in serum: Secondary | ICD-10-CM | POA: Diagnosis not present

## 2023-06-29 DIAGNOSIS — E669 Obesity, unspecified: Secondary | ICD-10-CM | POA: Diagnosis not present

## 2023-06-29 DIAGNOSIS — M797 Fibromyalgia: Secondary | ICD-10-CM | POA: Diagnosis not present

## 2023-06-29 DIAGNOSIS — M1990 Unspecified osteoarthritis, unspecified site: Secondary | ICD-10-CM | POA: Diagnosis not present

## 2023-07-10 ENCOUNTER — Ambulatory Visit (INDEPENDENT_AMBULATORY_CARE_PROVIDER_SITE_OTHER)

## 2023-07-10 ENCOUNTER — Ambulatory Visit (INDEPENDENT_AMBULATORY_CARE_PROVIDER_SITE_OTHER): Admitting: Podiatry

## 2023-07-10 ENCOUNTER — Encounter: Payer: Self-pay | Admitting: Podiatry

## 2023-07-10 DIAGNOSIS — M779 Enthesopathy, unspecified: Secondary | ICD-10-CM | POA: Diagnosis not present

## 2023-07-10 DIAGNOSIS — M19071 Primary osteoarthritis, right ankle and foot: Secondary | ICD-10-CM

## 2023-07-10 MED ORDER — METHYLPREDNISOLONE 4 MG PO TBPK: ORAL_TABLET | ORAL | 0 refills | Status: AC

## 2023-07-10 NOTE — Progress Notes (Signed)
   Chief Complaint  Patient presents with   Foot Pain    RM7: np Achilles tendinitis left more than right     HPI: 48 y.o. female presenting today as a new patient for evaluation of bilateral foot pain left greater than the right.  She experiences pain and tenderness almost on a daily basis.  Pain throughout the dorsum of the foot bilateral.  She also experiences some posterior heel pain and Achilles tendon pain intermittently.  Past Medical History:  Diagnosis Date   Anxiety    Fibromyalgia    Gestational diabetes    glyburide   Hx of varicella    Scheuermann disease     Past Surgical History:  Procedure Laterality Date   CESAREAN SECTION     CESAREAN SECTION N/A 10/16/2016   Procedure: CESAREAN SECTION;  Surgeon: Jaymes Graff, MD;  Location: [facility];  Service: Obstetrics;  Laterality: N/A;  Odelia Gage, RNFA   MOUTH SURGERY      Allergies  Allergen Reactions   Penicillins Anaphylaxis   Aleve [Naproxen] Itching   Orlistat Diarrhea   Keflex [Cephalexin] Rash   Other Rash    Dodecyl gallate - allergic contact dermatitis     Physical Exam: General: The patient is alert and oriented x3 in no acute distress.  Dermatology: Skin is warm, dry and supple bilateral lower extremities.   Vascular: Palpable pedal pulses bilaterally. Capillary refill within normal limits.  No appreciable edema.  No erythema.  Neurological: Grossly intact via light touch  Musculoskeletal Exam: Prominence noted to the dorsum of the bilateral feet overlying the first TMT consistent with arthritic changes to the joint.  There is some tenderness with palpation as well.  Mild tenderness with palpation of the posterior tubercle of the calcaneus bilateral  Radiographic Exam B/L feet 07/13/2023:  Normal osseous mineralization.  Small posterior heel spur noted bilateral.   degenerative changes and peri-articular spurring noted to the first TMT bilateral.  No acute fractures  identified.  Assessment/Plan of Care: 1.  Posterior heel spur with insertional Achilles tendinitis bilateral 2.  DJD/arthritis first TMT bilateral. RT > LT.   -Patient evaluated.  X-rays reviewed -Prescription for Medrol Dosepak -Discussed the pathology and etiology of the posterior heel spur as well as tendinitis bilateral.  For now recommend daily stretching exercises specifically to the Achilles tendon which were demonstrated today as well as shoe gear that does not irritate the posterior heel. -In regards to the arthritic changes to the first TMT bilateral, for now we will pursue conservative treatment.  Recommend shoes that do not irritate the dorsum of the foot.  Ultimately there is surgery to treat the arthritic changes to the first TMT however for now we will continue to pursue conservative treatment management -Return to clinic as needed       Felecia Shelling, DPM [facility]  Dr. Felecia Shelling, DPM    [address] [place], [place] [address]                Office [phone]  Fax [phone]

## 2023-08-20 DIAGNOSIS — Z8632 Personal history of gestational diabetes: Secondary | ICD-10-CM | POA: Diagnosis not present

## 2023-08-20 DIAGNOSIS — E041 Nontoxic single thyroid nodule: Secondary | ICD-10-CM | POA: Diagnosis not present

## 2023-09-29 DIAGNOSIS — E669 Obesity, unspecified: Secondary | ICD-10-CM | POA: Diagnosis not present

## 2023-09-29 DIAGNOSIS — M1990 Unspecified osteoarthritis, unspecified site: Secondary | ICD-10-CM | POA: Diagnosis not present

## 2023-09-29 DIAGNOSIS — M797 Fibromyalgia: Secondary | ICD-10-CM | POA: Diagnosis not present

## 2023-09-29 DIAGNOSIS — M0579 Rheumatoid arthritis with rheumatoid factor of multiple sites without organ or systems involvement: Secondary | ICD-10-CM | POA: Diagnosis not present

## 2023-09-29 DIAGNOSIS — R768 Other specified abnormal immunological findings in serum: Secondary | ICD-10-CM | POA: Diagnosis not present

## 2023-09-29 DIAGNOSIS — Z79899 Other long term (current) drug therapy: Secondary | ICD-10-CM | POA: Diagnosis not present

## 2023-10-20 DIAGNOSIS — E669 Obesity, unspecified: Secondary | ICD-10-CM | POA: Diagnosis not present

## 2023-10-20 DIAGNOSIS — M797 Fibromyalgia: Secondary | ICD-10-CM | POA: Diagnosis not present

## 2023-10-22 DIAGNOSIS — M1990 Unspecified osteoarthritis, unspecified site: Secondary | ICD-10-CM | POA: Diagnosis not present

## 2023-10-22 DIAGNOSIS — M069 Rheumatoid arthritis, unspecified: Secondary | ICD-10-CM | POA: Diagnosis not present

## 2023-10-22 DIAGNOSIS — M797 Fibromyalgia: Secondary | ICD-10-CM | POA: Diagnosis not present

## 2023-10-22 DIAGNOSIS — E669 Obesity, unspecified: Secondary | ICD-10-CM | POA: Diagnosis not present

## 2023-11-10 DIAGNOSIS — Z79899 Other long term (current) drug therapy: Secondary | ICD-10-CM | POA: Diagnosis not present

## 2023-12-30 DIAGNOSIS — Z79899 Other long term (current) drug therapy: Secondary | ICD-10-CM | POA: Diagnosis not present

## 2023-12-30 DIAGNOSIS — M0579 Rheumatoid arthritis with rheumatoid factor of multiple sites without organ or systems involvement: Secondary | ICD-10-CM | POA: Diagnosis not present

## 2023-12-30 DIAGNOSIS — M1990 Unspecified osteoarthritis, unspecified site: Secondary | ICD-10-CM | POA: Diagnosis not present

## 2023-12-30 DIAGNOSIS — E669 Obesity, unspecified: Secondary | ICD-10-CM | POA: Diagnosis not present

## 2023-12-30 DIAGNOSIS — R768 Other specified abnormal immunological findings in serum: Secondary | ICD-10-CM | POA: Diagnosis not present

## 2023-12-30 DIAGNOSIS — M797 Fibromyalgia: Secondary | ICD-10-CM | POA: Diagnosis not present

## 2024-01-21 DIAGNOSIS — L821 Other seborrheic keratosis: Secondary | ICD-10-CM | POA: Diagnosis not present

## 2024-01-21 DIAGNOSIS — L814 Other melanin hyperpigmentation: Secondary | ICD-10-CM | POA: Diagnosis not present

## 2024-01-21 DIAGNOSIS — D1801 Hemangioma of skin and subcutaneous tissue: Secondary | ICD-10-CM | POA: Diagnosis not present

## 2024-01-21 DIAGNOSIS — L718 Other rosacea: Secondary | ICD-10-CM | POA: Diagnosis not present

## 2024-03-01 DIAGNOSIS — R5383 Other fatigue: Secondary | ICD-10-CM | POA: Diagnosis not present

## 2024-03-01 DIAGNOSIS — Z79899 Other long term (current) drug therapy: Secondary | ICD-10-CM | POA: Diagnosis not present

## 2024-03-01 DIAGNOSIS — M797 Fibromyalgia: Secondary | ICD-10-CM | POA: Diagnosis not present

## 2024-03-01 DIAGNOSIS — M0579 Rheumatoid arthritis with rheumatoid factor of multiple sites without organ or systems involvement: Secondary | ICD-10-CM | POA: Diagnosis not present

## 2024-03-01 DIAGNOSIS — R682 Dry mouth, unspecified: Secondary | ICD-10-CM | POA: Diagnosis not present

## 2024-03-01 DIAGNOSIS — E669 Obesity, unspecified: Secondary | ICD-10-CM | POA: Diagnosis not present
